# Patient Record
Sex: Female | Born: 1980 | Race: White | Hispanic: No | Marital: Single | Smoking: Current every day smoker
Health system: Southern US, Community
[De-identification: ages and names within clinical notes are randomized; demographics above are authoritative.]

## PROBLEM LIST (undated history)

## (undated) DIAGNOSIS — M1990 Unspecified osteoarthritis, unspecified site: Secondary | ICD-10-CM

## (undated) DIAGNOSIS — A4902 Methicillin resistant Staphylococcus aureus infection, unspecified site: Secondary | ICD-10-CM

## (undated) DIAGNOSIS — R569 Unspecified convulsions: Secondary | ICD-10-CM

## (undated) HISTORY — PX: REPLACEMENT TOTAL KNEE BILATERAL: SUR1225

## (undated) HISTORY — PX: OTHER SURGICAL HISTORY: SHX169

## (undated) HISTORY — PX: ABDOMINAL SURGERY: SHX537

## (undated) HISTORY — PX: KNEE SURGERY: SHX244

---

## 2002-06-28 ENCOUNTER — Emergency Department (HOSPITAL_COMMUNITY): Admission: EM | Admit: 2002-06-28 | Discharge: 2002-06-28 | Payer: Self-pay | Admitting: Emergency Medicine

## 2005-08-07 ENCOUNTER — Ambulatory Visit (HOSPITAL_COMMUNITY): Admission: RE | Admit: 2005-08-07 | Discharge: 2005-08-07 | Payer: Self-pay | Admitting: *Deleted

## 2005-08-14 ENCOUNTER — Ambulatory Visit: Payer: Self-pay | Admitting: *Deleted

## 2005-10-02 ENCOUNTER — Ambulatory Visit: Payer: Self-pay | Admitting: Family Medicine

## 2011-03-12 ENCOUNTER — Emergency Department (HOSPITAL_BASED_OUTPATIENT_CLINIC_OR_DEPARTMENT_OTHER)
Admission: EM | Admit: 2011-03-12 | Discharge: 2011-03-12 | Disposition: A | Discharge status: Home / Self Care | Payer: Self-pay | Attending: Emergency Medicine | Admitting: Emergency Medicine

## 2011-03-12 DIAGNOSIS — F1910 Other psychoactive substance abuse, uncomplicated: Secondary | ICD-10-CM | POA: Insufficient documentation

## 2011-03-12 DIAGNOSIS — R21 Rash and other nonspecific skin eruption: Secondary | ICD-10-CM | POA: Insufficient documentation

## 2011-03-12 DIAGNOSIS — L089 Local infection of the skin and subcutaneous tissue, unspecified: Secondary | ICD-10-CM | POA: Insufficient documentation

## 2011-03-12 LAB — POCT TOXICOLOGY PANEL
Benzodiazepines: POSITIVE
Cocaine: POSITIVE
Tetrahydrocannabinol: POSITIVE

## 2011-03-12 LAB — COMPREHENSIVE METABOLIC PANEL
Albumin: 3.4 g/dL — ABNORMAL LOW (ref 3.5–5.2)
Glucose, Bld: 107 mg/dL — ABNORMAL HIGH (ref 70–99)
Potassium: 4.1 mEq/L (ref 3.5–5.1)

## 2011-03-12 LAB — CBC
HCT: 31.1 % — ABNORMAL LOW (ref 36.0–46.0)
Hemoglobin: 10.1 g/dL — ABNORMAL LOW (ref 12.0–15.0)
MCH: 25.1 pg — ABNORMAL LOW (ref 26.0–34.0)
MCHC: 32.5 g/dL (ref 30.0–36.0)
Platelets: 305 10*3/uL (ref 150–400)
RDW: 18 % — ABNORMAL HIGH (ref 11.5–15.5)

## 2011-03-12 LAB — DIFFERENTIAL
Basophils Absolute: 0 10*3/uL (ref 0.0–0.1)
Basophils Relative: 0 % (ref 0–1)
Lymphocytes Relative: 25 % (ref 12–46)
Lymphs Abs: 2.5 10*3/uL (ref 0.7–4.0)
Monocytes Relative: 6 % (ref 3–12)
Neutro Abs: 6.6 10*3/uL (ref 1.7–7.7)
Neutrophils Relative %: 68 % (ref 43–77)

## 2011-03-12 LAB — PREGNANCY, URINE: Preg Test, Ur: NEGATIVE

## 2011-03-12 LAB — URINALYSIS, ROUTINE W REFLEX MICROSCOPIC: Hgb urine dipstick: NEGATIVE

## 2011-06-02 ENCOUNTER — Emergency Department (HOSPITAL_BASED_OUTPATIENT_CLINIC_OR_DEPARTMENT_OTHER)
Admission: EM | Admit: 2011-06-02 | Discharge: 2011-06-02 | Disposition: A | Discharge status: Other Acute Inpatient Hospital | Payer: Self-pay | Attending: Emergency Medicine | Admitting: Emergency Medicine

## 2011-06-02 ENCOUNTER — Emergency Department (INDEPENDENT_AMBULATORY_CARE_PROVIDER_SITE_OTHER): Payer: Self-pay

## 2011-06-02 DIAGNOSIS — K5900 Constipation, unspecified: Secondary | ICD-10-CM

## 2011-06-02 DIAGNOSIS — L02219 Cutaneous abscess of trunk, unspecified: Secondary | ICD-10-CM

## 2011-06-02 DIAGNOSIS — R109 Unspecified abdominal pain: Secondary | ICD-10-CM | POA: Insufficient documentation

## 2011-06-02 DIAGNOSIS — T8131XA Disruption of external operation (surgical) wound, not elsewhere classified, initial encounter: Secondary | ICD-10-CM | POA: Insufficient documentation

## 2011-06-02 DIAGNOSIS — Y838 Other surgical procedures as the cause of abnormal reaction of the patient, or of later complication, without mention of misadventure at the time of the procedure: Secondary | ICD-10-CM | POA: Insufficient documentation

## 2011-06-02 DIAGNOSIS — K567 Ileus, unspecified: Secondary | ICD-10-CM

## 2011-06-02 DIAGNOSIS — J90 Pleural effusion, not elsewhere classified: Secondary | ICD-10-CM

## 2011-06-02 HISTORY — DX: Unspecified convulsions: R56.9

## 2011-06-02 LAB — URINALYSIS, ROUTINE W REFLEX MICROSCOPIC
Bilirubin Urine: NEGATIVE
Leukocytes, UA: NEGATIVE
Protein, ur: NEGATIVE mg/dL
Specific Gravity, Urine: 1.017 (ref 1.005–1.030)

## 2011-06-02 LAB — BASIC METABOLIC PANEL
BUN: 11 mg/dL (ref 6–23)
Calcium: 9.6 mg/dL (ref 8.4–10.5)
Chloride: 100 mEq/L (ref 96–112)
Creatinine, Ser: 0.7 mg/dL (ref 0.50–1.10)
GFR calc Af Amer: >60 mL/min (ref >60)
GFR calc non Af Amer: >60 mL/min (ref >60)
Glucose, Bld: 97 mg/dL (ref 70–99)
Potassium: 4.7 mEq/L (ref 3.5–5.1)
Sodium: 137 mEq/L (ref 135–145)

## 2011-06-02 LAB — CBC
MCV: 82.8 fL (ref 78.0–100.0)
Platelets: 364 10*3/uL (ref 150–400)
RDW: 17.6 % — ABNORMAL HIGH (ref 11.5–15.5)
WBC: 11.8 10*3/uL — ABNORMAL HIGH (ref 4.0–10.5)

## 2011-06-02 LAB — DIFFERENTIAL: Basophils Relative: 0 % (ref 0–1)

## 2011-06-02 MED ORDER — VANCOMYCIN HCL IN DEXTROSE 1-5 GM/200ML-% IV SOLN
INTRAVENOUS | Status: AC
  Administered 2011-06-02: 1 g
  Filled 2011-06-02: qty 200

## 2011-06-02 MED ORDER — HYDROMORPHONE HCL 1 MG/ML IJ SOLN
0.5000 mg | Freq: Once | INTRAMUSCULAR | Status: AC
  Administered 2011-06-02: 0.5 mg via INTRAVENOUS

## 2011-06-02 MED ORDER — VANCOMYCIN HCL 10 G IV SOLR: 1.0000 g | Freq: Once | INTRAVENOUS | Status: AC

## 2011-06-02 MED ORDER — SODIUM CHLORIDE 0.9 % IV SOLN
INTRAVENOUS | Status: DC
  Administered 2011-06-02: 17:00:00 via INTRAVENOUS
  Filled 2011-06-02: qty 1000

## 2011-06-02 MED ORDER — HYDROMORPHONE HCL 1 MG/ML IJ SOLN
INTRAMUSCULAR | Status: AC
  Administered 2011-06-02: 0.5 mg via INTRAVENOUS
  Filled 2011-06-02: qty 1

## 2011-06-02 MED ORDER — MOXIFLOXACIN HCL IN NACL 400 MG/250ML IV SOLN
400.0000 mg | Freq: Once | INTRAVENOUS | Status: AC
  Administered 2011-06-02: 400 mg via INTRAVENOUS
  Filled 2011-06-02: qty 250

## 2011-06-02 MED ORDER — FENTANYL CITRATE 0.05 MG/ML IJ SOLN
50.0000 ug | Freq: Once | INTRAMUSCULAR | Status: AC
  Administered 2011-06-02: 50 ug via INTRAVENOUS
  Filled 2011-06-02: qty 2

## 2011-06-02 NOTE — ED Notes (Signed)
Pt. Report given to Tommy RN with [facility] by RN Earlene Plater.   RN Earlene Plater to call report to [facility]

## 2011-06-02 NOTE — ED Notes (Signed)
Pt. Is alert and oriented with no resp. Distress and no c/o nausea or vomiting just PAIN.

## 2011-06-02 NOTE — ED Notes (Signed)
Family at bedside. 

## 2011-06-02 NOTE — ED Notes (Signed)
Pt. Has shown no s/s of seizure activity.  Pt. Neuro status is WNL.  Pt. Resting with eyes closed after meds given.

## 2011-06-02 NOTE — ED Notes (Signed)
Pt. Records have been faxed from [facility] to [facility].  RN Earlene Plater gave records to Dr. Nicanor Alcon.

## 2011-06-02 NOTE — ED Notes (Signed)
Pt. Went into a verbal escalation about wanting to leave due to the medicine taking too long to infuse.  Per Pt. "I gotta go now, I gotta go.  Explained to pt. The Dr. Is waiting to review her xray results and the abx. Was ordered by the EDP.  Pt. con't to have a fit and report I gotta go.  Dr.  Nicanor Alcon called into the Pt. Room and she sat and spoke with Pt. About needs to stay and Pt. Actions were compliant while Dr. Nicanor Alcon in the room.  After the EDP left the room the Pt. Reports "I want to leave".  Explained to Pt. If she was planning to go I the RN needs to report to the EDP before I administer pain meds.  Pt. Calling on the call bell 5 times in 10 mins.  Pt. Is in no distress and is alert and oriented.

## 2011-06-02 NOTE — ED Notes (Signed)
Admitted on 7/1 for infected abscess right groin; stayed in hospital x 1 week; d/c yesterday; increased pain and drainage; is currently on doxycycline; surgeon: Dr Sondra Come in [facility]; states she didn't go to [facility] because they were told her a 3 hour wait; so they came here

## 2011-06-02 NOTE — ED Provider Notes (Signed)
History     Chief Complaint  Patient presents with  . Wound Dehiscence   Patient is a 30 y.o. female presenting with abdominal pain. The history is provided by the patient. No language interpreter was used.  Abdominal Pain The primary symptoms of the illness include abdominal pain. The primary symptoms of the illness do not include fever, fatigue, shortness of breath, diarrhea, dysuria, vaginal discharge or vaginal bleeding. The current episode started more than 2 days ago. The onset of the illness was gradual. The problem has not changed since onset. The illness is associated with a recent illness. The patient has not had a change in bowel habit. Risk factors for an acute abdominal problem include a history of abdominal surgery. Symptoms associated with the illness do not include chills, anorexia, diaphoresis, heartburn, urgency or frequency.  Patient was discharged from [facility] yesterday after having abdominal wall surgery on Thursday.  Has not followed up with the surgeon and is having increasing pain and was told by the office to go to [facility] but did not go there due to wait time.   Past Medical History  Diagnosis Date  . Seizures     No past surgical history on file.  No family history on file.  History  Substance Use Topics  . Smoking status: Current Everyday Smoker -- 0.5 packs/day  . Smokeless tobacco: Not on file  . Alcohol Use: No    OB History    Grav Para Term Preterm Abortions TAB SAB Ect Mult Living                  Review of Systems  Constitutional: Negative for fever, chills, diaphoresis and fatigue.  HENT: Negative for facial swelling.   Eyes: Negative for discharge.  Respiratory: Negative for shortness of breath.   Cardiovascular: Negative for chest pain.  Gastrointestinal: Positive for abdominal pain. Negative for heartburn, diarrhea and anorexia.  Genitourinary: Negative for dysuria, urgency, frequency, vaginal bleeding, vaginal discharge and  difficulty urinating.  Musculoskeletal: Negative.   Neurological: Negative.   Hematological: Negative.   Psychiatric/Behavioral: Negative.     Physical Exam  BP 115/56  Pulse 88  Temp(Src) 98.9 F (37.2 C) (Oral)  Resp 20  SpO2 97%  Physical Exam  Constitutional: She is oriented to person, place, and time. She appears well-developed and well-nourished.  HENT:  Head: Normocephalic and atraumatic.  Eyes: EOM are normal. Pupils are equal, round, and reactive to light. Left eye exhibits no discharge.  Neck: Normal range of motion. Neck supple.  Cardiovascular: Normal rate and regular rhythm.   Pulmonary/Chest: Effort normal and breath sounds normal.  Abdominal: Bowel sounds are normal. There is tenderness. There is no rebound and no guarding.    Genitourinary: There is no rash on the right labia. There is no rash on the left labia.  Musculoskeletal: Normal range of motion.  Neurological: She is alert and oriented to person, place, and time.  Skin: Skin is warm and dry.  Psychiatric: Her behavior is normal.    ED Course  Procedures  MDM Previous admission at [facility] reviewed     Alvaro Aungst Smitty Cords, MD 06/02/11 1903

## 2011-06-02 NOTE — ED Notes (Signed)
Patient is resting comfortably. 

## 2011-06-02 NOTE — ED Notes (Signed)
Report called to [employer] on [employer]

## 2011-06-02 NOTE — ED Notes (Signed)
Vital signs stable. 

## 2011-07-29 ENCOUNTER — Emergency Department (INDEPENDENT_AMBULATORY_CARE_PROVIDER_SITE_OTHER): Payer: Medicaid Other

## 2011-07-29 ENCOUNTER — Emergency Department (HOSPITAL_BASED_OUTPATIENT_CLINIC_OR_DEPARTMENT_OTHER)
Admission: EM | Admit: 2011-07-29 | Discharge: 2011-07-29 | Disposition: A | Discharge status: Home / Self Care | Payer: Medicaid Other | Attending: Emergency Medicine | Admitting: Emergency Medicine

## 2011-07-29 ENCOUNTER — Encounter (HOSPITAL_BASED_OUTPATIENT_CLINIC_OR_DEPARTMENT_OTHER): Payer: Self-pay | Admitting: *Deleted

## 2011-07-29 DIAGNOSIS — A4902 Methicillin resistant Staphylococcus aureus infection, unspecified site: Secondary | ICD-10-CM

## 2011-07-29 DIAGNOSIS — M79609 Pain in unspecified limb: Secondary | ICD-10-CM | POA: Insufficient documentation

## 2011-07-29 DIAGNOSIS — M2550 Pain in unspecified joint: Secondary | ICD-10-CM

## 2011-07-29 DIAGNOSIS — M546 Pain in thoracic spine: Secondary | ICD-10-CM

## 2011-07-29 DIAGNOSIS — R1032 Left lower quadrant pain: Secondary | ICD-10-CM | POA: Insufficient documentation

## 2011-07-29 DIAGNOSIS — L0291 Cutaneous abscess, unspecified: Secondary | ICD-10-CM

## 2011-07-29 DIAGNOSIS — M549 Dorsalgia, unspecified: Secondary | ICD-10-CM

## 2011-07-29 DIAGNOSIS — M5380 Other specified dorsopathies, site unspecified: Secondary | ICD-10-CM | POA: Insufficient documentation

## 2011-07-29 DIAGNOSIS — R109 Unspecified abdominal pain: Secondary | ICD-10-CM

## 2011-07-29 DIAGNOSIS — M62838 Other muscle spasm: Secondary | ICD-10-CM

## 2011-07-29 LAB — CBC
HCT: 29.9 % — ABNORMAL LOW (ref 36.0–46.0)
Hemoglobin: 9.6 g/dL — ABNORMAL LOW (ref 12.0–15.0)
MCH: 26.7 pg (ref 26.0–34.0)
MCHC: 32.1 g/dL (ref 30.0–36.0)
MCV: 83.1 fL (ref 78.0–100.0)
Platelets: 308 10*3/uL (ref 150–400)
RBC: 3.6 MIL/uL — ABNORMAL LOW (ref 3.87–5.11)
RDW: 16.6 % — ABNORMAL HIGH (ref 11.5–15.5)
WBC: 11 10*3/uL — ABNORMAL HIGH (ref 4.0–10.5)

## 2011-07-29 LAB — DIFFERENTIAL
Basophils Absolute: 0 10*3/uL (ref 0.0–0.1)
Basophils Relative: 0 % (ref 0–1)
Eosinophils Absolute: 0.5 K/uL (ref 0.0–0.7)
Eosinophils Relative: 5 % (ref 0–5)
Lymphocytes Relative: 26 % (ref 12–46)
Lymphs Abs: 2.8 K/uL (ref 0.7–4.0)
Monocytes Absolute: 1.1 K/uL — ABNORMAL HIGH (ref 0.1–1.0)
Monocytes Relative: 10 % (ref 3–12)
Neutro Abs: 6.6 10*3/uL (ref 1.7–7.7)
Neutrophils Relative %: 60 % (ref 43–77)

## 2011-07-29 LAB — COMPREHENSIVE METABOLIC PANEL WITH GFR
Alkaline Phosphatase: 77 U/L (ref 39–117)
BUN: 19 mg/dL (ref 6–23)
Calcium: 8.7 mg/dL (ref 8.4–10.5)
GFR calc Af Amer: >60 mL/min (ref >60)
Glucose, Bld: 83 mg/dL (ref 70–99)
Potassium: 4.2 meq/L (ref 3.5–5.1)
Total Protein: 7.6 g/dL (ref 6.0–8.3)

## 2011-07-29 LAB — URINALYSIS, ROUTINE W REFLEX MICROSCOPIC
Bilirubin Urine: NEGATIVE
Glucose, UA: NEGATIVE mg/dL
Hgb urine dipstick: NEGATIVE
Ketones, ur: 15 mg/dL — AB
Nitrite: NEGATIVE
Protein, ur: NEGATIVE mg/dL
Specific Gravity, Urine: 1.021 (ref 1.005–1.030)
Urobilinogen, UA: 1 mg/dL (ref 0.0–1.0)
pH: 7.5 (ref 5.0–8.0)

## 2011-07-29 LAB — COMPREHENSIVE METABOLIC PANEL
ALT: 7 U/L (ref 0–35)
AST: 10 U/L (ref 0–37)
Albumin: 3 g/dL — ABNORMAL LOW (ref 3.5–5.2)
CO2: 25 mEq/L (ref 19–32)
Chloride: 100 mEq/L (ref 96–112)
Creatinine, Ser: 0.5 mg/dL (ref 0.50–1.10)
GFR calc non Af Amer: >60 mL/min (ref >60)
Sodium: 136 mEq/L (ref 135–145)
Total Bilirubin: 0.2 mg/dL — ABNORMAL LOW (ref 0.3–1.2)

## 2011-07-29 LAB — URINE MICROSCOPIC-ADD ON

## 2011-07-29 LAB — LIPASE, BLOOD: Lipase: 16 U/L (ref 11–59)

## 2011-07-29 MED ORDER — HYDROCODONE-ACETAMINOPHEN 5-500 MG PO TABS: 2.0000 | ORAL_TABLET | Freq: Four times a day (QID) | ORAL | Status: AC | PRN

## 2011-07-29 MED ORDER — KETOROLAC TROMETHAMINE 30 MG/ML IJ SOLN
30.0000 mg | Freq: Once | INTRAMUSCULAR | Status: AC
  Administered 2011-07-29: 30 mg via INTRAVENOUS
  Filled 2011-07-29: qty 1

## 2011-07-29 MED ORDER — HYDROMORPHONE HCL 1 MG/ML IJ SOLN
1.0000 mg | Freq: Once | INTRAMUSCULAR | Status: AC
  Administered 2011-07-29: 1 mg via INTRAVENOUS
  Filled 2011-07-29: qty 1

## 2011-07-29 MED ORDER — IOHEXOL 300 MG/ML  SOLN
100.0000 mL | Freq: Once | INTRAMUSCULAR | Status: AC | PRN
  Administered 2011-07-29: 100 mL via INTRAVENOUS

## 2011-07-29 MED ORDER — SODIUM CHLORIDE 0.9 % IV SOLN
Freq: Once | INTRAVENOUS | Status: AC
  Administered 2011-07-29: 20:00:00 via INTRAVENOUS

## 2011-07-29 MED ORDER — METAXALONE 800 MG PO TABS: 800.0000 mg | ORAL_TABLET | Freq: Three times a day (TID) | ORAL | Status: AC

## 2011-07-29 NOTE — ED Notes (Signed)
Dr Oletta Lamas at bedside to discuss test results.

## 2011-07-29 NOTE — ED Provider Notes (Signed)
History     CSN: [id number] Arrival date & time: 07/29/2011  6:58 PM  Chief Complaint  Patient presents with  . Joint Pain   HPI Comments: Patient reports that early this morning and last night she had turned in her sleep in the bed and had sudden onset of bilateral upper thigh and lower abdominal pain. She reports severe pain with moving her hips or upper legs were trying to bear weight. She reports it is too painful to bear weight and walk. 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 She did see her [facility] surgeon in clinic about 1.5 weeks prior and appeared to be healing well and was actually taken off of oral Bactrim. Family member reports that he had changed her dressing 2 days prior and noticed that there was a new buildup of green purulent fluid. Both he and the patient reports that there is a foul odor coming from it which is much worse than typical. She denies any fevers or chills. She denies any recent trauma or injury. She has had some pain in her low back recently but was not significant and had not been taking any medications for it. She denies any dysuria, and hematuria. She denies any flank pain. She denies numbness or weakness to her distal lower extremities. She denies any significant skin rash.  The history is provided by the patient and a relative.    Past Medical History  Diagnosis Date  . Seizures     Past Surgical History  Procedure Date  . Mrsa     No family history on file.  History  Substance Use Topics  . Smoking status: Current Everyday Smoker -- 0.5 packs/day  . Smokeless tobacco: Not on file  . Alcohol Use: No    OB History    Grav Para Term Preterm Abortions TAB SAB Ect Mult Living                  Review of Systems  All other systems reviewed and are negative.    Physical Exam  BP 104/61  Pulse 93  Temp(Src) 98.2 F (36.8 C) (Oral)  Resp 22   SpO2 100%  Physical Exam  Nursing note and vitals reviewed. Constitutional: She appears well-developed and well-nourished. She appears distressed.  HENT:  Head: Normocephalic and atraumatic.  Neck: Normal range of motion. Neck supple.  Cardiovascular: Normal rate.   Pulmonary/Chest: Effort normal. No respiratory distress.  Abdominal: Soft. There is tenderness in the left lower quadrant. There is guarding. There is no rigidity, no CVA tenderness, no tenderness at McBurney's point and negative Murphy's sign.    Musculoskeletal:       No deformity, rash, minimally tender to palpation of proximal thighs L>R.  No distal weakness plantar or dorsiflexion of ankles    ED Course  Procedures  MDM CT scan done to make sure infection/abscess was not moving internally.  Pt had back pain and here has some spasms.  CT scan is reassuring and I informed results to pt adn familly.  Will treat as muscle spasms and back pain.  Pt should establish with [facility] and begin seeing their primary care clinics.    Pt will need to continue dressing changes to wound area, keeping it clear. They can be rechecked by their surgeon next week as well.        Gavin Pound. Oletta Lamas, MD 07/29/11 2204

## 2011-07-29 NOTE — ED Notes (Signed)
Joint pain sudden onset last night.

## 2011-07-29 NOTE — Discharge Instructions (Signed)
Narcotic and benzodiazepine use may cause drowsiness, slowed breathing or dependence.  Please use with caution and do not drive, operate machinery or watch young children alone while taking them.  Taking combinations of these medications or drinking alcohol will potentiate these effects.    

## 2011-07-29 NOTE — ED Notes (Signed)
Pt assisted to bedside commode to provide urine specimen.

## 2011-07-29 NOTE — ED Notes (Signed)
Pt has had two surgeries to LLQ of abdomen to clean out MRSA. Last surgery was August 2nd. Pt had wound vac in place until two weeks ago, and then was instructed to do dressing changes at home, which pt has done daily. Husband states the appearance of the wound is not improving, has noticed greenish-yellow drainage and the odor is worsening. Pt reports severe pain to the wound site. Denies any new medications. Denies being on antibiotics at this time. Has a F/U appt at wound clinic this Friday.

## 2011-07-31 ENCOUNTER — Encounter (HOSPITAL_BASED_OUTPATIENT_CLINIC_OR_DEPARTMENT_OTHER): Payer: Self-pay

## 2011-07-31 ENCOUNTER — Emergency Department (HOSPITAL_BASED_OUTPATIENT_CLINIC_OR_DEPARTMENT_OTHER)
Admission: EM | Admit: 2011-07-31 | Discharge: 2011-07-31 | Disposition: A | Discharge status: Home / Self Care | Payer: Medicaid Other | Attending: Emergency Medicine | Admitting: Emergency Medicine

## 2011-07-31 DIAGNOSIS — R609 Edema, unspecified: Secondary | ICD-10-CM | POA: Insufficient documentation

## 2011-07-31 DIAGNOSIS — M2550 Pain in unspecified joint: Secondary | ICD-10-CM

## 2011-07-31 DIAGNOSIS — M79609 Pain in unspecified limb: Secondary | ICD-10-CM | POA: Insufficient documentation

## 2011-07-31 DIAGNOSIS — F17200 Nicotine dependence, unspecified, uncomplicated: Secondary | ICD-10-CM | POA: Insufficient documentation

## 2011-07-31 MED ORDER — HYDROCODONE-ACETAMINOPHEN 5-325 MG PO TABS
2.0000 | ORAL_TABLET | Freq: Once | ORAL | Status: AC
  Administered 2011-07-31: 2 via ORAL
  Filled 2011-07-31: qty 2

## 2011-07-31 NOTE — ED Notes (Signed)
Patient states she was here two days ago for same symptoms.  Patient states she has an appointment with a Rheumatologist @ [facility].  Patient states did not get her prescriptions filled due to finances.  Patient is here for pain management and a referral to an ortho md.

## 2011-07-31 NOTE — ED Provider Notes (Signed)
History     CSN: [id number] Arrival date & time: 07/31/2011 12:23 PM  Chief Complaint  Patient presents with  . Leg Pain   HPI Comments: Pt states that that she has been having ongoing leg pain and hand pain:pt states that she is supposed to see a rheumatologist in a couple of days:pt states that she was seen 2 days ago and she could not get her medication filled so she is here because she needs something for pain  Patient is a 30 y.o. female presenting with leg pain. The history is provided by the patient. No language interpreter was used.  Leg Pain  The incident occurred more than 1 week ago. There was no injury mechanism. The pain location is generalized. The quality of the pain is described as throbbing. The pain is severe. The pain has been constant since onset. Pertinent negatives include no numbness, no inability to bear weight and no loss of motion. She reports no foreign bodies present. The symptoms are aggravated by activity.    Past Medical History  Diagnosis Date  . Seizures     Past Surgical History  Procedure Date  . Mrsa     No family history on file.  History  Substance Use Topics  . Smoking status: Current Everyday Smoker -- 0.5 packs/day  . Smokeless tobacco: Not on file  . Alcohol Use: No    OB History    Grav Para Term Preterm Abortions TAB SAB Ect Mult Living                  Review of Systems  Constitutional: Negative for fever.  Neurological: Negative for numbness.  All other systems reviewed and are negative.    Physical Exam  BP 103/63  Pulse 105  Temp(Src) 98.2 F (36.8 C) (Oral)  Resp 22  Ht 5\' 1"  (1.549 m)  Wt 190 lb (86.183 kg)  BMI 35.90 kg/m2  SpO2 97%  LMP 07/26/2011  Physical Exam  Nursing note and vitals reviewed. Constitutional: She is oriented to person, place, and time. She appears well-developed and well-nourished.  Cardiovascular: Normal rate and regular rhythm.   Pulmonary/Chest: Effort normal and breath sounds  normal.  Abdominal: Soft.  Musculoskeletal:       No redness noted to extremity:mild edema noted to feet bilaterally  Neurological: She is alert and oriented to person, place, and time.  Skin: Skin is warm and dry.  Psychiatric: She has a normal mood and affect.    ED Course  Procedures  MDM Pt given dose of pain medication here:pt is to see rheumatology for work up:pt was given 25 hydrocodone script 2 days ago which she states she can't get filled until tommorrow  Medical screening examination/treatment/procedure(s) were performed by non-physician practitioner and as supervising physician I was immediately available for consultation/collaboration. Osvaldo Human, M.D.    Teressa Lower, NP 07/31/11 1305  Carleene Cooper III, MD 08/01/11 1037

## 2011-07-31 NOTE — ED Notes (Signed)
C/o increase to leg pain and abd wound-was seen here for same 2 days ago

## 2011-08-13 ENCOUNTER — Encounter (HOSPITAL_BASED_OUTPATIENT_CLINIC_OR_DEPARTMENT_OTHER): Payer: Self-pay | Admitting: *Deleted

## 2011-08-13 ENCOUNTER — Emergency Department (HOSPITAL_BASED_OUTPATIENT_CLINIC_OR_DEPARTMENT_OTHER)
Admission: EM | Admit: 2011-08-13 | Discharge: 2011-08-13 | Disposition: A | Discharge status: Home / Self Care | Payer: Medicaid Other | Attending: Emergency Medicine | Admitting: Emergency Medicine

## 2011-08-13 DIAGNOSIS — L02219 Cutaneous abscess of trunk, unspecified: Secondary | ICD-10-CM | POA: Insufficient documentation

## 2011-08-13 DIAGNOSIS — F17200 Nicotine dependence, unspecified, uncomplicated: Secondary | ICD-10-CM | POA: Insufficient documentation

## 2011-08-13 DIAGNOSIS — L03319 Cellulitis of trunk, unspecified: Secondary | ICD-10-CM | POA: Insufficient documentation

## 2011-08-13 DIAGNOSIS — L02211 Cutaneous abscess of abdominal wall: Secondary | ICD-10-CM

## 2011-08-13 MED ORDER — CLINDAMYCIN HCL 300 MG PO CAPS: 300.0000 mg | ORAL_CAPSULE | Freq: Three times a day (TID) | ORAL | Status: AC

## 2011-08-13 MED ORDER — IBUPROFEN 800 MG PO TABS
800.0000 mg | ORAL_TABLET | Freq: Once | ORAL | Status: AC
  Administered 2011-08-13: 800 mg via ORAL
  Filled 2011-08-13: qty 1

## 2011-08-13 NOTE — ED Provider Notes (Signed)
Medical screening examination/treatment/procedure(s) were performed by non-physician practitioner and as supervising physician I was immediately available for consultation/collaboration.   Juletta Berhe B. Bernette Mayers, MD 08/13/11 2128

## 2011-08-13 NOTE — ED Provider Notes (Signed)
History     CSN: [id number] Arrival date & time: 08/13/2011  7:51 PM   Chief Complaint  Patient presents with  . Abscess     (Include location/radiation/quality/duration/timing/severity/associated sxs/prior treatment) Patient is a 30 y.o. female presenting with abscess. The history is provided by the patient. No language interpreter was used.  Abscess  This is a recurrent problem. The current episode started more than one week ago. The problem occurs continuously. Progression since onset: pt states that it was getting better and then it got worse agian:pt states that she was given some antibiotics, but she is unsure of which ones. The abscess is present on the abdomen and genitalia. The problem is mild. The abscess is characterized by painfulness, redness and draining.     Past Medical History  Diagnosis Date  . Seizures      Past Surgical History  Procedure Date  . Mrsa     History reviewed. No pertinent family history.  History  Substance Use Topics  . Smoking status: Current Everyday Smoker -- 0.5 packs/day  . Smokeless tobacco: Not on file  . Alcohol Use: No    OB History    Grav Para Term Preterm Abortions TAB SAB Ect Mult Living                  Review of Systems  Constitutional: Negative.   HENT: Negative.   Respiratory: Negative.   Cardiovascular: Negative.   Musculoskeletal: Negative.   Skin: Positive for wound.    Allergies  Penicillins  Home Medications   Current Outpatient Rx  Name Route Sig Dispense Refill  . DIVALPROEX SODIUM 500 MG PO TBEC Oral Take 500-1,000 mg by mouth 2 (two) times daily. Take 2 tabs in the morning and 1 tab at bedtime    . DULOXETINE HCL 20 MG PO CPEP Oral Take 20 mg by mouth daily.     . DULOXETINE HCL 60 MG PO CPEP Oral Take 120 mg by mouth daily.     . QUETIAPINE FUMARATE 300 MG PO TABS Oral Take 600 mg by mouth at bedtime.     Marland Kitchen DOXYCYCLINE HYCLATE 100 MG PO CPEP Oral Take 100 mg by mouth 2 (two) times daily.       Marland Kitchen HYDROCODONE-ACETAMINOPHEN 10-650 MG PO TABS Oral Take 1 tablet by mouth every 6 (six) hours as needed.      Marland Kitchen HYDROCODONE-ACETAMINOPHEN 10-325 MG PO TABS Oral Take 1 tablet by mouth every 8 (eight) hours as needed. For pain    . IBUPROFEN 200 MG PO TABS Oral Take 800 mg by mouth every 6 (six) hours as needed. pain       Physical Exam    BP 130/76  Pulse 93  Temp(Src) 98.3 F (36.8 C) (Oral)  Resp 18  Ht 5\' 1"  (1.549 m)  Wt 190 lb (86.183 kg)  BMI 35.90 kg/m2  SpO2 96%  LMP 07/25/2011  Physical Exam  Nursing note and vitals reviewed. Constitutional: She appears well-developed and well-nourished.  HENT:  Head: Normocephalic.  Cardiovascular: Normal rate and regular rhythm.   Pulmonary/Chest: Effort normal and breath sounds normal.  Musculoskeletal: Normal range of motion.  Neurological: She is alert.  Skin:       Pt has multiple areas to pubic region that is are small white pustule with mild redness to the area:pt has a open wound in the fold of the left panus    ED Course  Procedures    No diagnosis found.   MDM  Pt has a recent ct scan to evaluate for not intra abdominal involvement:will treat with antibiotics       Teressa Lower, NP 08/13/11 2121

## 2011-08-13 NOTE — ED Notes (Signed)
2x2 placed over wound on left lower abd and secured with paper tape. Pt educated on keeping area clean and dry.

## 2011-08-13 NOTE — ED Notes (Signed)
Pt c/o abscess x 2 on vaginal area.

## 2011-12-20 ENCOUNTER — Encounter (HOSPITAL_BASED_OUTPATIENT_CLINIC_OR_DEPARTMENT_OTHER): Payer: Self-pay | Admitting: *Deleted

## 2011-12-20 ENCOUNTER — Emergency Department (HOSPITAL_BASED_OUTPATIENT_CLINIC_OR_DEPARTMENT_OTHER)
Admission: EM | Admit: 2011-12-20 | Discharge: 2011-12-21 | Disposition: A | Discharge status: Home / Self Care | Payer: Medicaid Other | Attending: Emergency Medicine | Admitting: Emergency Medicine

## 2011-12-20 ENCOUNTER — Emergency Department (HOSPITAL_BASED_OUTPATIENT_CLINIC_OR_DEPARTMENT_OTHER)
Admission: EM | Admit: 2011-12-20 | Discharge: 2011-12-20 | Disposition: A | Discharge status: Home / Self Care | Payer: Medicaid Other

## 2011-12-20 DIAGNOSIS — F17200 Nicotine dependence, unspecified, uncomplicated: Secondary | ICD-10-CM | POA: Insufficient documentation

## 2011-12-20 DIAGNOSIS — N83209 Unspecified ovarian cyst, unspecified side: Secondary | ICD-10-CM | POA: Insufficient documentation

## 2011-12-20 DIAGNOSIS — R1012 Left upper quadrant pain: Secondary | ICD-10-CM | POA: Insufficient documentation

## 2011-12-20 LAB — URINALYSIS, ROUTINE W REFLEX MICROSCOPIC
Ketones, ur: 15 mg/dL — AB
Nitrite: NEGATIVE
Protein, ur: NEGATIVE mg/dL

## 2011-12-20 LAB — PREGNANCY, URINE: Preg Test, Ur: NEGATIVE

## 2011-12-20 NOTE — ED Provider Notes (Signed)
History   This chart was scribed for Stacy Roller, MD by Charolett Bumpers . The patient was seen in room MH09/MH09 and the patient's care was started at 11:56pm.   CSN: [id number]  Arrival date & time 12/20/11  2006   First MD Initiated Contact with Patient 12/20/11 2348      Chief Complaint  Patient presents with  . Abdominal Pain    (Consider location/radiation/quality/duration/timing/severity/associated sxs/prior treatment) HPI Stacy Pratt is a 31 y.o. female who presents to the Emergency Department complaining of constant, moderate lower abdominal pain (center and right-sided) that started this morning. The patient reports that she had a similar pain a week ago, but it subsided. She reports being seen at [facility] where they performed a CT scan and found an ovarian cyst. Patient describes the pain as sharp, stabbing, and radiating to her back. Patient states that movement aggravates abdominal pain. Patient also reports associated vomiting and headache.Patient denies hematuria, dysuria, vaginal discharge, and vaginal itching. Patient reports no BMs, but previous BMs had blood in them. Patient reports that she attempted to eat twice today, but vomited each time. Patient also notes that she has an OB/GYN appointment in March about the ovarian cyst.    Past Medical History  Diagnosis Date  . Seizures     Past Surgical History  Procedure Date  . Mrsa     History reviewed. No pertinent family history.  History  Substance Use Topics  . Smoking status: Current Everyday Smoker -- 0.5 packs/day  . Smokeless tobacco: Not on file  . Alcohol Use: No    OB History    Grav Para Term Preterm Abortions TAB SAB Ect Mult Living                  Review of Systems A complete 10 system review of systems was obtained and is otherwise negative except as noted in the HPI and PMH.   Allergies  Penicillins  Home Medications   Current Outpatient Rx  Name Route Sig  Dispense Refill  . DIVALPROEX SODIUM 500 MG PO TBEC Oral Take 500-1,000 mg by mouth 2 (two) times daily. Take 1 tab in the morning and 2 tabs at night    . DULOXETINE HCL 60 MG PO CPEP Oral Take 180 mg by mouth daily.     . IBUPROFEN 200 MG PO TABS Oral Take 800 mg by mouth 2 (two) times daily as needed. For pain    . QUETIAPINE FUMARATE 300 MG PO TABS Oral Take 300 mg by mouth at bedtime.     Marland Kitchen NAPROXEN 500 MG PO TABS Oral Take 1 tablet (500 mg total) by mouth 2 (two) times daily with a meal. 30 tablet 0    BP 117/64  Pulse 76  Temp(Src) 98 F (36.7 C) (Oral)  Resp 20  Ht 5\' 1"  (1.549 m)  Wt 190 lb (86.183 kg)  BMI 35.90 kg/m2  SpO2 98%  LMP 12/13/2011  Physical Exam  Nursing note and vitals reviewed. Constitutional: She is oriented to person, place, and time. She appears well-developed and well-nourished. No distress.  HENT:  Head: Normocephalic and atraumatic.       Tongue is dry.  Eyes: EOM are normal. Pupils are equal, round, and reactive to light.  Neck: Neck supple. No tracheal deviation present.  Cardiovascular: Normal rate, regular rhythm and normal heart sounds.  Exam reveals no gallop and no friction rub.   No murmur heard. Pulmonary/Chest: Effort normal and  breath sounds normal. No respiratory distress. She has no wheezes. She has no rales.  Abdominal: Soft. Bowel sounds are normal. She exhibits no distension and no mass. There is no tenderness. There is no rebound and no guarding.  Musculoskeletal: Normal range of motion. She exhibits no edema.  Neurological: She is alert and oriented to person, place, and time. No sensory deficit.  Skin: Skin is warm and dry.  Psychiatric: She has a normal mood and affect. Her behavior is normal.    ED Course  Procedures (including critical care time)  DIAGNOSTIC STUDIES: Oxygen Saturation is 100% on room air, normal by my interpretation.    COORDINATION OF CARE:  0015: Medication Orders: Hydromorphone, Ondansetron, Sodium  Chloride 0.9%   Labs Reviewed  URINALYSIS, ROUTINE W REFLEX MICROSCOPIC - Abnormal; Notable for the following:    Color, Urine AMBER (*) BIOCHEMICALS MAY BE AFFECTED BY COLOR   Hgb urine dipstick SMALL (*)    Ketones, ur 15 (*)    Leukocytes, UA SMALL (*)    All other components within normal limits  URINE MICROSCOPIC-ADD ON - Abnormal; Notable for the following:    Squamous Epithelial / LPF FEW (*)    All other components within normal limits  CBC - Abnormal; Notable for the following:    RBC 3.69 (*)    Hemoglobin 9.2 (*)    HCT 29.0 (*)    MCH 24.9 (*)    RDW 20.6 (*)    All other components within normal limits  DIFFERENTIAL - Abnormal; Notable for the following:    Neutrophils Relative 40 (*)    Eosinophils Relative 6 (*)    All other components within normal limits  COMPREHENSIVE METABOLIC PANEL - Abnormal; Notable for the following:    BUN 26 (*)    Albumin 2.7 (*)    Total Bilirubin 0.1 (*)    All other components within normal limits  PREGNANCY, URINE   No results found.   1. Ovarian cyst       MDM  Patient reevaluated, has had some improvement with hydromorphone.  I have reviewed her medical records from the outside hospital and found that she has had 2 separate ultrasounds within the last 2 months each showing a complex cyst on the right ovary. As of one week ago this was found to appear to be a hemorrhagic cyst. I have given her intravenous hydromorphone as well as intravenous Toradol prior to discharge. She has had some improvement and is amenable to discharge and followup with OB/GYN. I have given her an extensive list for same. Patient is well appearing at discharge with normal vital signs.  She has had multiple visits to [facility] with multiple CT scans and ultrasounds all revealing the same diagnosis which is right ovarian cyst. There was some discrepancy regarding diagnosis is either hemorrhagic cyst versus complex mass. I have encouraged  her followup closely for further evaluation to make sure that a neoplasm has not missed. She has expressed her understanding and agrees to followup. I have given her the phone numbers for followup OB/GYN in [place].  Laboratory evaluation here shows mild anemia, this is consistent over time, otherwise no significant abnormal findings.   I personally performed the services described in this documentation, which was scribed in my presence. The recorded information has been reviewed and considered.         Stacy Roller, MD 12/21/11 [phone]

## 2011-12-20 NOTE — ED Notes (Signed)
Pt states she has a cyst on her ovary and that is what this feels like.

## 2011-12-20 NOTE — ED Notes (Signed)
Pt states she has a hx of osteoarthritis and is having generalized body aches, cough, decreased energy, N/V.  Also having some CP with cough, Feel dehydrated.

## 2011-12-21 ENCOUNTER — Encounter (HOSPITAL_BASED_OUTPATIENT_CLINIC_OR_DEPARTMENT_OTHER): Payer: Self-pay

## 2011-12-21 LAB — COMPREHENSIVE METABOLIC PANEL
ALT: 9 U/L (ref 0–35)
BUN: 26 mg/dL — ABNORMAL HIGH (ref 6–23)
CO2: 28 mEq/L (ref 19–32)
Calcium: 8.5 mg/dL (ref 8.4–10.5)
GFR calc Af Amer: >90 mL/min (ref >90)
GFR calc non Af Amer: >90 mL/min (ref >90)
Glucose, Bld: 87 mg/dL (ref 70–99)
Sodium: 140 mEq/L (ref 135–145)

## 2011-12-21 LAB — CBC
HCT: 29 % — ABNORMAL LOW (ref 36.0–46.0)
Hemoglobin: 9.2 g/dL — ABNORMAL LOW (ref 12.0–15.0)
MCH: 24.9 pg — ABNORMAL LOW (ref 26.0–34.0)
MCV: 78.6 fL (ref 78.0–100.0)
Platelets: 310 10*3/uL (ref 150–400)
RBC: 3.69 MIL/uL — ABNORMAL LOW (ref 3.87–5.11)
WBC: 6.9 10*3/uL (ref 4.0–10.5)

## 2011-12-21 LAB — DIFFERENTIAL
Eosinophils Relative: 6 % — ABNORMAL HIGH (ref 0–5)
Lymphocytes Relative: 43 % (ref 12–46)
Lymphs Abs: 2.9 10*3/uL (ref 0.7–4.0)
Monocytes Absolute: 0.7 10*3/uL (ref 0.1–1.0)
Monocytes Relative: 10 % (ref 3–12)

## 2011-12-21 MED ORDER — NAPROXEN 500 MG PO TABS: 500.0000 mg | ORAL_TABLET | Freq: Two times a day (BID) | ORAL | Status: DC

## 2011-12-21 MED ORDER — SODIUM CHLORIDE 0.9 % IV BOLUS (SEPSIS)
1000.0000 mL | Freq: Once | INTRAVENOUS | Status: AC
  Administered 2011-12-21: 1000 mL via INTRAVENOUS

## 2011-12-21 MED ORDER — KETOROLAC TROMETHAMINE 30 MG/ML IJ SOLN
30.0000 mg | Freq: Once | INTRAMUSCULAR | Status: AC
  Administered 2011-12-21: 30 mg via INTRAVENOUS

## 2011-12-21 MED ORDER — KETOROLAC TROMETHAMINE 60 MG/2ML IM SOLN: 60.0000 mg | Freq: Once | INTRAMUSCULAR | Status: DC

## 2011-12-21 MED ORDER — KETOROLAC TROMETHAMINE 30 MG/ML IJ SOLN
INTRAMUSCULAR | Status: AC
  Administered 2011-12-21: 30 mg via INTRAVENOUS
  Filled 2011-12-21: qty 1

## 2011-12-21 MED ORDER — HYDROMORPHONE HCL PF 1 MG/ML IJ SOLN
1.0000 mg | Freq: Once | INTRAMUSCULAR | Status: AC
  Administered 2011-12-21: 1 mg via INTRAVENOUS
  Filled 2011-12-21: qty 1

## 2011-12-21 MED ORDER — ONDANSETRON HCL 4 MG/2ML IJ SOLN
4.0000 mg | Freq: Once | INTRAMUSCULAR | Status: AC
  Administered 2011-12-21: 4 mg via INTRAVENOUS
  Filled 2011-12-21: qty 2

## 2011-12-21 NOTE — ED Notes (Signed)
Pt states that her pain is still very "tense" and that she would like another dose of pain medication.  Dr Hyacinth Meeker informed.  No orders received.

## 2012-01-11 ENCOUNTER — Emergency Department (INDEPENDENT_AMBULATORY_CARE_PROVIDER_SITE_OTHER): Payer: Medicaid Other

## 2012-01-11 ENCOUNTER — Emergency Department (HOSPITAL_BASED_OUTPATIENT_CLINIC_OR_DEPARTMENT_OTHER)
Admission: EM | Admit: 2012-01-11 | Discharge: 2012-01-11 | Disposition: A | Discharge status: Home / Self Care | Payer: Medicaid Other | Attending: Emergency Medicine | Admitting: Emergency Medicine

## 2012-01-11 ENCOUNTER — Encounter (HOSPITAL_BASED_OUTPATIENT_CLINIC_OR_DEPARTMENT_OTHER): Payer: Self-pay | Admitting: *Deleted

## 2012-01-11 DIAGNOSIS — R0602 Shortness of breath: Secondary | ICD-10-CM

## 2012-01-11 DIAGNOSIS — R0609 Other forms of dyspnea: Secondary | ICD-10-CM

## 2012-01-11 DIAGNOSIS — J111 Influenza due to unidentified influenza virus with other respiratory manifestations: Secondary | ICD-10-CM | POA: Insufficient documentation

## 2012-01-11 DIAGNOSIS — R059 Cough, unspecified: Secondary | ICD-10-CM | POA: Insufficient documentation

## 2012-01-11 DIAGNOSIS — R05 Cough: Secondary | ICD-10-CM | POA: Insufficient documentation

## 2012-01-11 DIAGNOSIS — R062 Wheezing: Secondary | ICD-10-CM

## 2012-01-11 DIAGNOSIS — F17200 Nicotine dependence, unspecified, uncomplicated: Secondary | ICD-10-CM | POA: Insufficient documentation

## 2012-01-11 DIAGNOSIS — Z79899 Other long term (current) drug therapy: Secondary | ICD-10-CM | POA: Insufficient documentation

## 2012-01-11 LAB — LIPASE, BLOOD: Lipase: 27 U/L (ref 11–59)

## 2012-01-11 LAB — URINALYSIS, ROUTINE W REFLEX MICROSCOPIC
Bilirubin Urine: NEGATIVE
Ketones, ur: NEGATIVE mg/dL
Nitrite: NEGATIVE
Specific Gravity, Urine: 1.015 (ref 1.005–1.030)
Urobilinogen, UA: 1 mg/dL (ref 0.0–1.0)

## 2012-01-11 LAB — COMPREHENSIVE METABOLIC PANEL
BUN: 17 mg/dL (ref 6–23)
CO2: 25 mEq/L (ref 19–32)
Calcium: 8.8 mg/dL (ref 8.4–10.5)
Creatinine, Ser: 0.7 mg/dL (ref 0.50–1.10)
GFR calc Af Amer: >90 mL/min (ref >90)
GFR calc non Af Amer: >90 mL/min (ref >90)
Glucose, Bld: 102 mg/dL — ABNORMAL HIGH (ref 70–99)

## 2012-01-11 LAB — VALPROIC ACID LEVEL: Valproic Acid Lvl: 69.9 ug/mL (ref 50.0–100.0)

## 2012-01-11 LAB — DIFFERENTIAL
Basophils Absolute: 0 10*3/uL (ref 0.0–0.1)
Basophils Relative: 0 % (ref 0–1)
Eosinophils Relative: 2 % (ref 0–5)
Lymphocytes Relative: 32 % (ref 12–46)
Neutro Abs: 3.5 10*3/uL (ref 1.7–7.7)

## 2012-01-11 LAB — URINE MICROSCOPIC-ADD ON

## 2012-01-11 LAB — CBC
MCHC: 32.1 g/dL (ref 30.0–36.0)
Platelets: 279 10*3/uL (ref 150–400)
RDW: 19.7 % — ABNORMAL HIGH (ref 11.5–15.5)
WBC: 6.6 10*3/uL (ref 4.0–10.5)

## 2012-01-11 MED ORDER — SODIUM CHLORIDE 0.9 % IV BOLUS (SEPSIS)
1000.0000 mL | Freq: Once | INTRAVENOUS | Status: AC
  Administered 2012-01-11: 1000 mL via INTRAVENOUS

## 2012-01-11 MED ORDER — HYDROMORPHONE HCL PF 1 MG/ML IJ SOLN
1.0000 mg | Freq: Once | INTRAMUSCULAR | Status: AC
  Administered 2012-01-11: 1 mg via INTRAVENOUS
  Filled 2012-01-11: qty 1

## 2012-01-11 MED ORDER — HYDROCODONE-ACETAMINOPHEN 5-325 MG PO TABS: 2.0000 | ORAL_TABLET | ORAL | Status: DC | PRN

## 2012-01-11 MED ORDER — ALBUTEROL SULFATE (5 MG/ML) 0.5% IN NEBU
5.0000 mg | INHALATION_SOLUTION | Freq: Once | RESPIRATORY_TRACT | Status: AC
  Administered 2012-01-11: 5 mg via RESPIRATORY_TRACT

## 2012-01-11 MED ORDER — IPRATROPIUM BROMIDE 0.02 % IN SOLN
0.5000 mg | Freq: Once | RESPIRATORY_TRACT | Status: AC
  Administered 2012-01-11: 0.5 mg via RESPIRATORY_TRACT

## 2012-01-11 MED ORDER — IPRATROPIUM BROMIDE 0.02 % IN SOLN
RESPIRATORY_TRACT | Status: AC
  Administered 2012-01-11: 0.5 mg via RESPIRATORY_TRACT
  Filled 2012-01-11: qty 2.5

## 2012-01-11 MED ORDER — ALBUTEROL SULFATE (5 MG/ML) 0.5% IN NEBU
INHALATION_SOLUTION | RESPIRATORY_TRACT | Status: AC
  Administered 2012-01-11: 5 mg via RESPIRATORY_TRACT
  Filled 2012-01-11: qty 1

## 2012-01-11 MED ORDER — ALBUTEROL SULFATE (5 MG/ML) 0.5% IN NEBU
5.0000 mg | INHALATION_SOLUTION | Freq: Once | RESPIRATORY_TRACT | Status: AC
  Administered 2012-01-11: 5 mg via RESPIRATORY_TRACT
  Filled 2012-01-11: qty 1

## 2012-01-11 NOTE — ED Notes (Signed)
Pt states she has had body aches, sore throat, N/V, and H/A for 2 days. Vomited x3-4 today.

## 2012-01-11 NOTE — ED Provider Notes (Signed)
History   Scribed for Doug Sou, MD, the patient was seen in MH01/[facility]01. The chart was scribed by Gilman Schmidt. The patients care was started at 6:18 PM.   CSN: [id number]  Arrival date & time 01/11/12  1709   First MD Initiated Contact with Patient 01/11/12 1814      Chief Complaint  Patient presents with  . Emesis    (Consider location/radiation/quality/duration/timing/severity/associated sxs/prior treatment) HPI Stacy Pratt is a 31 y.o. female who presents to the Emergency Department complaining of emesis onset two days. Pt also notes productive cough, sore throat, back pain, abdominal pain, vomiting, and runny nose, generalized body pains, and knee swelling. Denies any diarrhea. Pt has taken Ibuprofen and Tramadol. There are no other associated symptoms and no other alleviating or aggravating factors.    Prescribed by Dr. Bruna Potter  At [facility]  PCP: [facility] Past Medical History  Diagnosis Date  . Seizures     Past Surgical History  Procedure Date  . Mrsa     History reviewed. No pertinent family history.  History  Substance Use Topics  . Smoking status: Current Everyday Smoker -- 0.5 packs/day  . Smokeless tobacco: Not on file  . Alcohol Use: No    OB History    Grav Para Term Preterm Abortions TAB SAB Ect Mult Living                  Review of Systems  Constitutional: Negative for fever.  HENT: Positive for sore throat and rhinorrhea.   Respiratory: Positive for cough.   Gastrointestinal: Positive for vomiting and abdominal pain. Negative for diarrhea.  Musculoskeletal: Positive for back pain and joint swelling.  Psychiatric/Behavioral: The patient is nervous/anxious.   All other systems reviewed and are negative.    Allergies  Penicillins  Home Medications   Current Outpatient Rx  Name Route Sig Dispense Refill  . DIVALPROEX SODIUM 500 MG PO TBEC Oral Take 500-1,000 mg by mouth 2 (two) times daily. Take 1 tab in the morning  and 2 tabs at night    . DULOXETINE HCL 60 MG PO CPEP Oral Take 180 mg by mouth daily.     . IBUPROFEN 200 MG PO TABS Oral Take 800 mg by mouth 2 (two) times daily as needed. For pain    . PREDNISONE 5 MG PO TABS Oral Take 5 mg by mouth daily.    . QUETIAPINE FUMARATE 300 MG PO TABS Oral Take 300 mg by mouth at bedtime.     . TRAMADOL HCL 50 MG PO TABS Oral Take 50 mg by mouth every 6 (six) hours as needed. For pain      BP 116/75  Pulse 99  Temp(Src) 97.6 F (36.4 C) (Oral)  Resp 20  Ht 5\' 1"  (1.549 m)  Wt 190 lb (86.183 kg)  BMI 35.90 kg/m2  SpO2 97%  LMP 01/11/2012  Physical Exam  Nursing note and vitals reviewed. Constitutional: She appears well-developed and well-nourished.  HENT:  Head: Normocephalic and atraumatic.  Mouth/Throat: Posterior oropharyngeal erythema present.  Eyes: Conjunctivae are normal. Pupils are equal, round, and reactive to light.  Neck: Neck supple. No tracheal deviation present. No thyromegaly present.  Cardiovascular: Normal rate and regular rhythm.   No murmur heard. Pulmonary/Chest: Effort normal. She has wheezes (expiratory).  Abdominal: Soft. Bowel sounds are normal. She exhibits no distension. There is no tenderness.       Obese Dime size scab legion at right mid abdomen non tender  Musculoskeletal: Normal range of motion. She exhibits no edema and no tenderness.       No peripheral edema  Extremities w/o eema  Neurological: She is alert. Coordination normal.  Skin: Skin is warm and dry. No rash noted.  Psychiatric: Her mood appears anxious.    ED Course  Procedures (including critical care time)  Labs Reviewed  URINALYSIS, ROUTINE W REFLEX MICROSCOPIC - Abnormal; Notable for the following:    Hgb urine dipstick MODERATE (*)    Leukocytes, UA TRACE (*)    All other components within normal limits  URINE MICROSCOPIC-ADD ON - Abnormal; Notable for the following:    Bacteria, UA FEW (*)    All other components within normal limits    PREGNANCY, URINE   No results found.   No diagnosis found.  DIAGNOSTIC STUDIES: Oxygen Saturation is 98% on room air, normal by my interpretation.    COORDINATION OF CARE: 6:18om:  - Patient evaluated by ED physician, Dilaudid, Proventil, Atrovent, DG Chest, CBC, Diff, CMP, Lipase, Valproic acid, UA, Pregnancy, ordered  Patient has had no episodes of nausea or vomiting while in the emergency department Pain improved after treatment with intravenous narcotics   MDM  Suspect influenza-like illness based on exam Patient reports that tramadol does not adequately help pain Plan prescription hydioocodone -apap Stop ibuprofen or Aleve, as she is taking prednisone chronically and incidents of ulcer disease, emesis and GI bleeds increases with combination of and steroids Diagnosis#1 influenza-like illness #2 vomitting #3 chronic anemia I personally performed the services described in this documentation, which was scribed in my presence. The recorded information has been reviewed and considered.       Doug Sou, MD 01/11/12 2043

## 2012-01-21 ENCOUNTER — Encounter (HOSPITAL_BASED_OUTPATIENT_CLINIC_OR_DEPARTMENT_OTHER): Payer: Self-pay | Admitting: Family Medicine

## 2012-01-21 ENCOUNTER — Emergency Department (HOSPITAL_BASED_OUTPATIENT_CLINIC_OR_DEPARTMENT_OTHER)
Admission: EM | Admit: 2012-01-21 | Discharge: 2012-01-21 | Disposition: A | Discharge status: Home / Self Care | Payer: Medicaid Other | Attending: Emergency Medicine | Admitting: Emergency Medicine

## 2012-01-21 ENCOUNTER — Emergency Department (INDEPENDENT_AMBULATORY_CARE_PROVIDER_SITE_OTHER): Payer: Medicaid Other

## 2012-01-21 DIAGNOSIS — W19XXXA Unspecified fall, initial encounter: Secondary | ICD-10-CM

## 2012-01-21 DIAGNOSIS — F17200 Nicotine dependence, unspecified, uncomplicated: Secondary | ICD-10-CM | POA: Insufficient documentation

## 2012-01-21 DIAGNOSIS — W108XXA Fall (on) (from) other stairs and steps, initial encounter: Secondary | ICD-10-CM | POA: Insufficient documentation

## 2012-01-21 DIAGNOSIS — M8448XA Pathological fracture, other site, initial encounter for fracture: Secondary | ICD-10-CM

## 2012-01-21 DIAGNOSIS — M549 Dorsalgia, unspecified: Secondary | ICD-10-CM | POA: Insufficient documentation

## 2012-01-21 DIAGNOSIS — Z8739 Personal history of other diseases of the musculoskeletal system and connective tissue: Secondary | ICD-10-CM | POA: Insufficient documentation

## 2012-01-21 HISTORY — DX: Unspecified osteoarthritis, unspecified site: M19.90

## 2012-01-21 MED ORDER — ONDANSETRON 8 MG PO TBDP
8.0000 mg | ORAL_TABLET | Freq: Once | ORAL | Status: AC
  Administered 2012-01-21: 8 mg via ORAL
  Filled 2012-01-21: qty 1

## 2012-01-21 MED ORDER — OXYCODONE-ACETAMINOPHEN 5-325 MG PO TABS
2.0000 | ORAL_TABLET | Freq: Once | ORAL | Status: AC
  Administered 2012-01-21: 2 via ORAL
  Filled 2012-01-21: qty 2

## 2012-01-21 MED ORDER — OXYCODONE-ACETAMINOPHEN 5-325 MG PO TABS: 1.0000 | ORAL_TABLET | ORAL | Status: AC | PRN

## 2012-01-21 MED ORDER — KETOROLAC TROMETHAMINE 60 MG/2ML IM SOLN
60.0000 mg | Freq: Once | INTRAMUSCULAR | Status: AC
  Administered 2012-01-21: 60 mg via INTRAMUSCULAR
  Filled 2012-01-21: qty 2

## 2012-01-21 MED ORDER — DIAZEPAM 5 MG PO TABS
5.0000 mg | ORAL_TABLET | Freq: Once | ORAL | Status: AC
  Administered 2012-01-21: 5 mg via ORAL
  Filled 2012-01-21: qty 1

## 2012-01-21 NOTE — ED Notes (Signed)
Pt c/o slipping down stairs last night and injuring right low back. Pt ambulatory into dept.

## 2012-01-21 NOTE — Discharge Instructions (Signed)

## 2012-01-21 NOTE — ED Notes (Signed)
No IV present during this ED visit

## 2012-01-21 NOTE — ED Notes (Signed)
Patient transported to X-ray 

## 2012-01-21 NOTE — ED Provider Notes (Signed)
History     CSN: [id number]  Arrival date & time 01/21/12  4098   First MD Initiated Contact with Patient 01/21/12 1845      Chief Complaint  Patient presents with  . Back Pain     Patient is a 31 y.o. female presenting with back pain. The history is provided by the patient.  Back Pain  This is a new problem. The current episode started 12 to 24 hours ago. The problem occurs constantly. The problem has been gradually worsening. The pain is associated with falling. The pain is present in the lumbar spine. The quality of the pain is described as aching. The pain does not radiate. The pain is moderate. The symptoms are aggravated by bending and twisting. The pain is the same all the time. Pertinent negatives include no chest pain, no abdominal pain, no paresis and no weakness.  Pt reports she was walking down steps, she slipped down 6 steps, and she landed on her back and she reports she hit her head.  No LOC.  No neck pain.  She reports back pain.  She denies abd pain.  She also reports pain in her right leg.  She reports it hurts to walk No vomiting.  No cp/sob   Past Medical History  Diagnosis Date  . Seizures   . Arthritis     Past Surgical History  Procedure Date  . Mrsa     No family history on file.  History  Substance Use Topics  . Smoking status: Current Everyday Smoker -- 0.5 packs/day  . Smokeless tobacco: Not on file  . Alcohol Use: No    OB History    Grav Para Term Preterm Abortions TAB SAB Ect Mult Living                  Review of Systems  Cardiovascular: Negative for chest pain.  Gastrointestinal: Negative for abdominal pain.  Musculoskeletal: Positive for back pain.  Neurological: Negative for weakness.  All other systems reviewed and are negative.    Allergies  Penicillins  Home Medications   Current Outpatient Rx  Name Route Sig Dispense Refill  . DIVALPROEX SODIUM 500 MG PO TBEC Oral Take 500-1,000 mg by mouth 2 (two) times daily. Take  1 tab in the morning and 2 tabs at night    . DULOXETINE HCL 60 MG PO CPEP Oral Take 180 mg by mouth daily.     . IBUPROFEN 200 MG PO TABS Oral Take 800 mg by mouth 2 (two) times daily as needed. For pain    . QUETIAPINE FUMARATE 300 MG PO TABS Oral Take 300 mg by mouth at bedtime.       BP 121/59  Pulse 92  Temp(Src) 97.7 F (36.5 C) (Oral)  Resp 16  SpO2 98%  LMP 01/11/2012  Physical Exam CONSTITUTIONAL: Well developed/well nourished HEAD AND FACE: Normocephalic/atraumatic EYES: EOMI/PERRL ENMT: Mucous membranes moist NECK: supple no meningeal signs SPINE:cervical/thoracic spine nontender.  Lumbar spine tender, No bruising/crepitance/stepoffs noted to spine CV: S1/S2 noted, no murmurs/rubs/gallops noted LUNGS: Lungs are clear to auscultation bilaterally, no apparent distress ABDOMEN: soft, nontender, no rebound or guarding GU:no cva tenderness NEURO: Pt is awake/alert, moves all extremitiesx4, no focal motor deficits noted in the lower extremities EXTREMITIES: pulses normal, full ROM.  Bruising noted along right thigh but no tenderness to ROM of right hip All other extremities/joints palpated/ranged and nontender SKIN: warm, color normal PSYCH: no abnormalities of mood noted  ED Course  Procedures   Pt ambulatory, pain improved Stable for d/c  The patient appears reasonably screened and/or stabilized for discharge and I doubt any other medical condition or other [facility] requiring further screening, evaluation, or treatment in the ED at this time prior to discharge.   MDM  Nursing notes reviewed and considered in documentation xrays reviewed and considered Previous records reviewed and considered Narcotic database reviewed        Joya Gaskins, MD 01/21/12 2105

## 2012-02-10 ENCOUNTER — Emergency Department (HOSPITAL_BASED_OUTPATIENT_CLINIC_OR_DEPARTMENT_OTHER)
Admission: EM | Admit: 2012-02-10 | Discharge: 2012-02-10 | Disposition: A | Discharge status: Home / Self Care | Payer: Medicaid Other

## 2012-02-10 NOTE — ED Notes (Signed)
Registration clerk states that pt was seen ambulating out of dept to her car with a steady gait. Will wait to see if pt returns for further treatment.

## 2012-02-10 NOTE — ED Notes (Signed)
Pt not available in the waiting room after mutliple calls.

## 2012-02-10 NOTE — ED Notes (Signed)
Called to front lobby by registration to assess pt stating that she had just had a seizure. Pt had slid from w/c into floor and then was assisted back into chair. Pt is not post-ictal, no incontinence noted, pt is alert and oriented x4, denies any injury. Pt is here c/o tooth pain and was also treated at [facility] last night for the same. Pt stating that she needs stronger meds for her tooth pain because no narcotics were given last night.

## 2012-02-10 NOTE — ED Notes (Signed)
The patient has been called for the third time. There is no answer from the patient again.

## 2012-03-21 ENCOUNTER — Emergency Department (HOSPITAL_BASED_OUTPATIENT_CLINIC_OR_DEPARTMENT_OTHER)
Admission: EM | Admit: 2012-03-21 | Discharge: 2012-03-21 | Disposition: A | Discharge status: Home / Self Care | Payer: Medicaid Other | Attending: Emergency Medicine | Admitting: Emergency Medicine

## 2012-03-21 ENCOUNTER — Encounter (HOSPITAL_BASED_OUTPATIENT_CLINIC_OR_DEPARTMENT_OTHER): Payer: Self-pay | Admitting: *Deleted

## 2012-03-21 DIAGNOSIS — M2550 Pain in unspecified joint: Secondary | ICD-10-CM | POA: Insufficient documentation

## 2012-03-21 DIAGNOSIS — F17200 Nicotine dependence, unspecified, uncomplicated: Secondary | ICD-10-CM | POA: Insufficient documentation

## 2012-03-21 DIAGNOSIS — R52 Pain, unspecified: Secondary | ICD-10-CM

## 2012-03-21 MED ORDER — KETOROLAC TROMETHAMINE 60 MG/2ML IM SOLN
60.0000 mg | Freq: Once | INTRAMUSCULAR | Status: AC
  Administered 2012-03-21: 60 mg via INTRAMUSCULAR
  Filled 2012-03-21: qty 2

## 2012-03-21 MED ORDER — OXYCODONE-ACETAMINOPHEN 5-325 MG PO TABS: 2.0000 | ORAL_TABLET | ORAL | Status: AC | PRN

## 2012-03-21 MED ORDER — OXYCODONE-ACETAMINOPHEN 5-325 MG PO TABS
2.0000 | ORAL_TABLET | Freq: Once | ORAL | Status: AC
  Administered 2012-03-21: 2 via ORAL
  Filled 2012-03-21: qty 2

## 2012-03-21 NOTE — Discharge Instructions (Signed)
Musculoskeletal Pain   Musculoskeletal pain is muscle and boney aches and pains. These pains can occur in any part of the body. Your caregiver may treat you without knowing the cause of the pain. They may treat you if blood or urine tests, X-rays, and other tests were normal.   CAUSES   There is often not a definite cause or reason for these pains. These pains may be caused by a type of germ (virus). The discomfort may also come from overuse. Overuse includes working out too hard when your body is not fit. Boney aches also come from weather changes. Bone is sensitive to atmospheric pressure changes.   HOME CARE INSTRUCTIONS   Ask when your test results will be ready. Make sure you get your test results.   Only take over-the-counter or prescription medicines for pain, discomfort, or fever as directed by your caregiver. If you were given medications for your condition, do not drive, operate machinery or power tools, or sign legal documents for 24 hours. Do not drink alcohol. Do not take sleeping pills or other medications that may interfere with treatment.   Continue all activities unless the activities cause more pain. When the pain lessens, slowly resume normal activities. Gradually increase the intensity and duration of the activities or exercise.   During periods of severe pain, bed rest may be helpful. Lay or sit in any position that is comfortable.   Putting ice on the injured area.   Put ice in a bag.   Place a towel between your skin and the bag.   Leave the ice on for 15 to 20 minutes, 3 to 4 times a day.   Follow up with your caregiver for continued problems and no reason can be found for the pain. If the pain becomes worse or does not go away, it may be necessary to repeat tests or do additional testing. Your caregiver may need to look further for a possible cause.   SEEK IMMEDIATE MEDICAL CARE IF:   You have pain that is getting worse and is not relieved by medications.   You develop chest pain that is  associated with shortness or breath, sweating, feeling sick to your stomach (nauseous), or throw up (vomit).   Your pain becomes localized to the abdomen.   You develop any new symptoms that seem different or that concern you.   MAKE SURE YOU:   Understand these instructions.   Will watch your condition.   Will get help right away if you are not doing well or get worse.   Document Released: 11/10/2005 Document Revised: 10/30/2011 Document Reviewed: 06/30/2008   [employer]® Patient Information ©2012 [employer].

## 2012-03-21 NOTE — ED Notes (Signed)
Pt states she got up this morning with joint pain. Hx same 1 yr ago. Could not stand up.

## 2012-03-21 NOTE — ED Provider Notes (Signed)
Medical screening examination/treatment/procedure(s) were performed by non-physician practitioner and as supervising physician I was immediately available for consultation/collaboration.  Caidynce Muzyka, MD 03/21/12 2159 

## 2012-03-21 NOTE — ED Provider Notes (Signed)
History     CSN: [id number]  Arrival date & time 03/21/12  1428   First MD Initiated Contact with Patient 03/21/12 1544      Chief Complaint  Patient presents with  . Joint Pain    (Consider location/radiation/quality/duration/timing/severity/associated sxs/prior treatment) HPI Comments: Pt is c/o joint pain that started this morning:pt states that she has history of similar symptoms over the last year:pt states that she was diagnosed with arthritis and she is supposed to see "a specialist" in the next two weeks:pt denies fever or swelling to her joints  The history is provided by the patient. No language interpreter was used.    Past Medical History  Diagnosis Date  . Seizures   . Arthritis     Past Surgical History  Procedure Date  . Mrsa     History reviewed. No pertinent family history.  History  Substance Use Topics  . Smoking status: Current Everyday Smoker -- 0.5 packs/day  . Smokeless tobacco: Not on file  . Alcohol Use: No    OB History    Grav Para Term Preterm Abortions TAB SAB Ect Mult Living                  Review of Systems  Constitutional: Negative.   Respiratory: Negative.   Cardiovascular: Negative.   Genitourinary: Negative.   Neurological: Negative.     Allergies  Penicillins  Home Medications   Current Outpatient Rx  Name Route Sig Dispense Refill  . DIVALPROEX SODIUM 500 MG PO TBEC Oral Take 500-1,000 mg by mouth 2 (two) times daily. Take 1 tab in the morning and 2 tabs at night    . DULOXETINE HCL 60 MG PO CPEP Oral Take 180 mg by mouth daily.     . IBUPROFEN 200 MG PO TABS Oral Take 800 mg by mouth 2 (two) times daily as needed. Patient used this medication for her body pain.    Marland Kitchen QUETIAPINE FUMARATE 300 MG PO TABS Oral Take 300 mg by mouth at bedtime.       BP 115/65  Pulse 100  Temp(Src) 97.8 F (36.6 C) (Oral)  Resp 20  Ht 5\' 1"  (1.549 m)  Wt 190 lb (86.183 kg)  BMI 35.90 kg/m2  SpO2 100%  LMP  03/19/2012  Physical Exam  Nursing note and vitals reviewed. Constitutional: She is oriented to person, place, and time. She appears well-developed and well-nourished.  HENT:  Head: Normocephalic and atraumatic.  Eyes: EOM are normal.  Neck: Neck supple.  Cardiovascular: Normal rate and regular rhythm.   Pulmonary/Chest: Effort normal and breath sounds normal.  Musculoskeletal:       Pt has full rom of extremities:pulses intact:no swelling noted:pt has no redness or warmth noted   Neurological: She is alert and oriented to person, place, and time.  Skin: Skin is warm and dry.  Psychiatric: She has a normal mood and affect.    ED Course  Procedures (including critical care time)  Labs Reviewed - No data to display No results found.   1. Pain       MDM  Pt feeling better after toradol and percocet:pt is able to ambulate without any problem:don't think imaging or lab work is needed at this time        Teressa Lower, NP 03/21/12 1702

## 2012-09-13 ENCOUNTER — Encounter (HOSPITAL_BASED_OUTPATIENT_CLINIC_OR_DEPARTMENT_OTHER): Payer: Self-pay | Admitting: Emergency Medicine

## 2012-09-13 ENCOUNTER — Emergency Department (HOSPITAL_BASED_OUTPATIENT_CLINIC_OR_DEPARTMENT_OTHER)
Admission: EM | Admit: 2012-09-13 | Discharge: 2012-09-13 | Disposition: A | Discharge status: Home / Self Care | Payer: Medicaid Other | Attending: Emergency Medicine | Admitting: Emergency Medicine

## 2012-09-13 DIAGNOSIS — F17200 Nicotine dependence, unspecified, uncomplicated: Secondary | ICD-10-CM | POA: Insufficient documentation

## 2012-09-13 DIAGNOSIS — Z8739 Personal history of other diseases of the musculoskeletal system and connective tissue: Secondary | ICD-10-CM | POA: Insufficient documentation

## 2012-09-13 DIAGNOSIS — G40909 Epilepsy, unspecified, not intractable, without status epilepticus: Secondary | ICD-10-CM | POA: Insufficient documentation

## 2012-09-13 DIAGNOSIS — Z79899 Other long term (current) drug therapy: Secondary | ICD-10-CM | POA: Insufficient documentation

## 2012-09-13 DIAGNOSIS — B029 Zoster without complications: Secondary | ICD-10-CM | POA: Insufficient documentation

## 2012-09-13 MED ORDER — ACYCLOVIR 800 MG PO TABS: ORAL_TABLET | ORAL | Status: DC

## 2012-09-13 MED ORDER — ACYCLOVIR 200 MG PO CAPS
800.0000 mg | ORAL_CAPSULE | Freq: Once | ORAL | Status: AC
  Administered 2012-09-13: 800 mg via ORAL
  Filled 2012-09-13: qty 4

## 2012-09-13 MED ORDER — ONDANSETRON HCL 4 MG/2ML IJ SOLN
4.0000 mg | Freq: Once | INTRAMUSCULAR | Status: AC
  Administered 2012-09-13: 4 mg via INTRAMUSCULAR
  Filled 2012-09-13: qty 2

## 2012-09-13 MED ORDER — OXYCODONE-ACETAMINOPHEN 5-325 MG PO TABS: 1.0000 | ORAL_TABLET | ORAL | Status: DC | PRN

## 2012-09-13 MED ORDER — HYDROMORPHONE HCL PF 2 MG/ML IJ SOLN
2.0000 mg | Freq: Once | INTRAMUSCULAR | Status: AC
  Administered 2012-09-13: 2 mg via INTRAMUSCULAR

## 2012-09-13 MED ORDER — HYDROMORPHONE HCL PF 2 MG/ML IJ SOLN
2.0000 mg | Freq: Once | INTRAMUSCULAR | Status: DC
  Filled 2012-09-13: qty 1

## 2012-09-13 NOTE — ED Notes (Signed)
Dr. Davidson at bedside.

## 2012-09-13 NOTE — ED Provider Notes (Signed)
History   This chart was scribed for Carleene Cooper III, MD by Sofie Rower. The patient was seen in room MH04/MH04 and the patient's care was started at 10:08PM.     CSN: [id number]  Arrival date & time 09/13/12  2028   First MD Initiated Contact with Patient 09/13/12 2208      Chief Complaint  Patient presents with  . Rash    (Consider location/radiation/quality/duration/timing/severity/associated sxs/prior treatment) Patient is a 31 y.o. female presenting with rash. The history is provided by the patient. No language interpreter was used.  Rash  This is a new problem. The current episode started 2 days ago. The problem has been gradually worsening. The problem is associated with an unknown factor. There has been no fever. The rash is present on the back, abdomen and groin. The pain is moderate. The pain has been constant since onset. Associated symptoms include itching and pain. She has tried nothing for the symptoms. The treatment provided no relief.    Stacy Pratt is a 31 y.o. female , with a hx of MRSA, who presents to the Emergency Department complaining of sudden, progressively worsening, rash, located on the back, abdomen, and inner thighs bilaterally, onset two days ago (09/11/12).  Associated symptoms include itchiness located at the back and abdomen. The pt reports she is experiencing an extremely painful rash which began two days ago, causing her an inability to sleep while lying flat on her back at night. Modifying factors include lying down in certain positions which intensifies the pain from the rash. The pt has a hx of drainage of abscess located at the left groin and underneath her left breast.   The pt denies any fever associated with the rash.   The pt is a current everyday smoker (0.5 packs/day), however, she does not drink alcohol.    Pt does not have a PCP.    Past Medical History  Diagnosis Date  . Seizures   . Arthritis     Past Surgical History  Procedure  Date  . Mrsa     No family history on file.  History  Substance Use Topics  . Smoking status: Current Every Day Smoker -- 0.5 packs/day  . Smokeless tobacco: Not on file  . Alcohol Use: No    OB History    Grav Para Term Preterm Abortions TAB SAB Ect Mult Living                  Review of Systems  Skin: Positive for itching and rash.  All other systems reviewed and are negative.    Allergies  Penicillins and Toradol  Home Medications   Current Outpatient Rx  Name Route Sig Dispense Refill  . DIVALPROEX SODIUM 500 MG PO TBEC Oral Take 500-1,000 mg by mouth 2 (two) times daily. Take 1 tab in the morning and 2 tabs at night    . DULOXETINE HCL 60 MG PO CPEP Oral Take 180 mg by mouth daily.     . IBUPROFEN 200 MG PO TABS Oral Take 800 mg by mouth 2 (two) times daily as needed. Patient used this medication for her body pain.    Marland Kitchen QUETIAPINE FUMARATE 300 MG PO TABS Oral Take 300 mg by mouth at bedtime.       BP 122/75  Pulse 100  Temp 98.1 F (36.7 C) (Oral)  Resp 20  Ht 5\' 1"  (1.549 m)  Wt 198 lb (89.812 kg)  BMI 37.41 kg/m2  SpO2 99%  LMP 08/30/2012  Physical Exam  Nursing note and vitals reviewed. Constitutional: She is oriented to person, place, and time. She appears well-developed and well-nourished.       Pt is morbidly obese.   HENT:  Head: Atraumatic.  Nose: Nose normal.  Eyes: Conjunctivae normal and EOM are normal.  Neck: Normal range of motion.  Musculoskeletal: Normal range of motion.  Neurological: She is alert and oriented to person, place, and time.  Skin: Skin is warm and dry. Rash noted.       Fine vesicular rash that goes from the midline of the left lumbar region, extending to the abdomen and left medial thigh.   Psychiatric: She has a normal mood and affect. Her behavior is normal.    ED Course  Procedures (including critical care time)  DIAGNOSTIC STUDIES: Oxygen Saturation is 99% on room air, normal by my interpretation.     COORDINATION OF CARE:  10:14 PM- Treatment plan concerning management of shingles, pain management, and antiviral medication discussed with patient. Pt agrees with treatment.           1. Herpes zoster     I personally performed the services described in this documentation, which was scribed in my presence. The recorded information has been reviewed and considered.  Osvaldo Human, MD     Carleene Cooper III, MD 09/13/12 2229

## 2012-09-13 NOTE — ED Notes (Signed)
Diffuse Red Rash on back and abdomen x2 days.  Burns and itches. Pt reports some drainage earlier.

## 2012-09-25 ENCOUNTER — Emergency Department (HOSPITAL_BASED_OUTPATIENT_CLINIC_OR_DEPARTMENT_OTHER): Payer: Medicaid Other

## 2012-09-25 ENCOUNTER — Encounter (HOSPITAL_BASED_OUTPATIENT_CLINIC_OR_DEPARTMENT_OTHER): Payer: Self-pay | Admitting: *Deleted

## 2012-09-25 ENCOUNTER — Emergency Department (HOSPITAL_BASED_OUTPATIENT_CLINIC_OR_DEPARTMENT_OTHER)
Admission: EM | Admit: 2012-09-25 | Discharge: 2012-09-25 | Disposition: A | Discharge status: Home / Self Care | Payer: Medicaid Other | Attending: Emergency Medicine | Admitting: Emergency Medicine

## 2012-09-25 DIAGNOSIS — W1789XA Other fall from one level to another, initial encounter: Secondary | ICD-10-CM | POA: Insufficient documentation

## 2012-09-25 DIAGNOSIS — M25569 Pain in unspecified knee: Secondary | ICD-10-CM

## 2012-09-25 DIAGNOSIS — Y92009 Unspecified place in unspecified non-institutional (private) residence as the place of occurrence of the external cause: Secondary | ICD-10-CM | POA: Insufficient documentation

## 2012-09-25 DIAGNOSIS — Z880 Allergy status to penicillin: Secondary | ICD-10-CM | POA: Insufficient documentation

## 2012-09-25 DIAGNOSIS — F17200 Nicotine dependence, unspecified, uncomplicated: Secondary | ICD-10-CM | POA: Insufficient documentation

## 2012-09-25 DIAGNOSIS — Z79899 Other long term (current) drug therapy: Secondary | ICD-10-CM | POA: Insufficient documentation

## 2012-09-25 DIAGNOSIS — W19XXXA Unspecified fall, initial encounter: Secondary | ICD-10-CM

## 2012-09-25 DIAGNOSIS — Z888 Allergy status to other drugs, medicaments and biological substances status: Secondary | ICD-10-CM | POA: Insufficient documentation

## 2012-09-25 DIAGNOSIS — S8990XA Unspecified injury of unspecified lower leg, initial encounter: Secondary | ICD-10-CM | POA: Insufficient documentation

## 2012-09-25 DIAGNOSIS — Y9389 Activity, other specified: Secondary | ICD-10-CM | POA: Insufficient documentation

## 2012-09-25 MED ORDER — OXYCODONE-ACETAMINOPHEN 5-325 MG PO TABS: 1.0000 | ORAL_TABLET | ORAL | Status: DC | PRN

## 2012-09-25 MED ORDER — NAPROXEN 250 MG PO TABS
500.0000 mg | ORAL_TABLET | Freq: Once | ORAL | Status: AC
  Administered 2012-09-25: 500 mg via ORAL
  Filled 2012-09-25: qty 2

## 2012-09-25 MED ORDER — HYDROMORPHONE HCL PF 1 MG/ML IJ SOLN
0.5000 mg | Freq: Once | INTRAMUSCULAR | Status: AC
  Administered 2012-09-25: 0.5 mg via INTRAMUSCULAR
  Filled 2012-09-25: qty 1

## 2012-09-25 MED ORDER — HYDROCODONE-ACETAMINOPHEN 5-325 MG PO TABS
2.0000 | ORAL_TABLET | Freq: Once | ORAL | Status: AC
  Administered 2012-09-25: 2 via ORAL
  Filled 2012-09-25: qty 2

## 2012-09-25 NOTE — ED Notes (Signed)
MD at bedside. 

## 2012-09-25 NOTE — Progress Notes (Signed)
On arrival to patients room for ace wrapping and crutch instruction, patient requesting pain medication. Patient dresses herself without any difficulty, using full range of motion in affected extremity to put on pants, socks, and shoes. No difficulty with ambulation noted. Patients crutches fitted appropriately for height and instructions for use given . Patient again requesting additional pain medicine.

## 2012-09-25 NOTE — ED Notes (Signed)
Pt presents to ED today with bilateral knee pain after slipping on bricks and landing on knees.  Pt took no otc meds pta

## 2012-09-25 NOTE — ED Notes (Addendum)
Called to room. Pt states that she is still hurting. She also states that "the last time I was here, they gave me a shot of Dilaudid which immediately took the pain away." Dr. Rulon Abide advised.

## 2012-09-25 NOTE — ED Provider Notes (Signed)
History  This chart was scribed for Jones Skene, MD by Shari Heritage and Marlin Canary. The patient was seen in room MH05/MH05. Patient's care was started at 1542.  CSN: [id number]  Arrival date & time 09/25/12  1442   First MD Initiated Contact with Patient 09/25/12 1542      Chief Complaint  Patient presents with  . Knee Pain     The history is provided by the patient. No language interpreter was used.    Stacy Pratt is a 31 y.o. female who presents to the Emergency Department complaining of constant, moderate to severe, bilateral knee pain that radiates down to her toes onset 2 hours ago due to a fall. Patient states that she slipped on [place] and [place]. Patient says that she has exacerbated some soreness due to a bilateral arthroscopy she had done 3 weeks ago. Patient rates pain as 7/10, but it is worse in her left knee. Patient denies HA, changes in vision or LOC. She denies any head injury during the fall. She denies taking any pain medication at home, but states that she got a shot of Dilaudid for pain last time she was here. Patient denies any history of diabetes or heart disease. She has a history of epilepsy and states that she had a seizure 2 weeks ago after she ran out of Klonopin. Other medical history includes arthritis. Patient smokes.  Orthopedist - Dr. Juel Burrow of [facility]   Past Medical History  Diagnosis Date  . Seizures   . Arthritis     Past Surgical History  Procedure Date  . Mrsa     History reviewed. No pertinent family history.  History  Substance Use Topics  . Smoking status: Current Every Day Smoker -- 0.5 packs/day  . Smokeless tobacco: Not on file  . Alcohol Use: No    OB History    Grav Para Term Preterm Abortions TAB SAB Ect Mult Living                  Review of Systems At least 10pt or greater review of systems completed and are negative except where specified in the HPI.  Allergies  Penicillins and  Toradol  Home Medications   Current Outpatient Rx  Name Route Sig Dispense Refill  . ACYCLOVIR 800 MG PO TABS  Take one tablet five times per day for seven days. 35 tablet 0  . DIVALPROEX SODIUM 500 MG PO TBEC Oral Take 500-1,000 mg by mouth 2 (two) times daily. Take 1 tab in the morning and 2 tabs at night    . DULOXETINE HCL 60 MG PO CPEP Oral Take 180 mg by mouth daily.     . IBUPROFEN 200 MG PO TABS Oral Take 800 mg by mouth 2 (two) times daily as needed. Patient used this medication for her body pain.    . OXYCODONE-ACETAMINOPHEN 5-325 MG PO TABS Oral Take 1 tablet by mouth every 4 (four) hours as needed for pain. 20 tablet 0  . QUETIAPINE FUMARATE 300 MG PO TABS Oral Take 300 mg by mouth at bedtime.       BP 118/66  Pulse 93  Temp 98.8 F (37.1 C)  Resp 20  Ht 5\' 1"  (1.549 m)  Wt 190 lb (86.183 kg)  BMI 35.90 kg/m2  SpO2 99%  LMP 08/30/2012  Physical Exam   Nursing notes reviewed.  Electronic medical record reviewed. VITAL SIGNS:   Filed Vitals:   09/25/12  1454 09/25/12 1658  BP: 118/66 105/69  Pulse: 93 68  Temp: 98.8 F (37.1 C) 98.2 F (36.8 C)  TempSrc:  Oral  Resp: 20 20  Height: 5\' 1"  (1.549 m)   Weight: 190 lb (86.183 kg)   SpO2: 99% 99%   CONSTITUTIONAL: Awake, oriented, appears non-toxic HENT: Atraumatic, normocephalic, oral mucosa pink and moist, airway patent. Nares patent without drainage. External ears normal. EYES: Conjunctiva clear, EOMI, PERRLA NECK: Trachea midline, non-tender, supple CARDIOVASCULAR: Normal heart rate, Normal rhythm, No murmurs, rubs, gallops PULMONARY/CHEST: Clear to auscultation, no rhonchi, wheezes, or rales. Symmetrical breath sounds. Non-tender. ABDOMINAL: Non-distended, soft, non-tender - no rebound or guarding.  BS normal. NEUROLOGIC: Non-focal, moving all four extremities, no gross sensory or motor deficits. EXTREMITIES: No clubbing, cyanosis, or edema. Mild left knee pre-patellar effusion.  Some TTP over patella.  No  joint line tenderness.  Joint appears stable, though patient exam is limited secondary to pain.  No gross swelling or discoloration. SKIN: Warm, Dry, No erythema, No rash 7 cm abrasion to left anterior lateral thigh.   ED Course  Procedures (including critical care time) DIAGNOSTIC STUDIES: Oxygen Saturation is 99% on room air. normal by my interpretation.    COORDINATION OF CARE: 4:20pm- Patient informed of current plan for treatment and evaluation and agrees with plan at this time.     Labs Reviewed - No data to display  Dg Knee 2 Views Left  09/25/2012  *RADIOLOGY REPORT*  Clinical Data: Larey Seat.  Injured knee.  LEFT KNEE - 1-2 VIEW  Comparison: None  Findings: There are medial and lateral compartment degenerative changes with joint space narrowing and early spurring.  No fracture or osteochondral lesion.  A small to moderate sized suprapatellar knee joint effusion is noted.  IMPRESSION:  1.  Medial and lateral compartment degenerative changes. 2.  No acute fracture. 3.  Moderate sized joint effusion.   Original Report Authenticated By: Rudie Meyer, M.D.      1. Fall   2. Knee pain       MDM  Stacy Pratt is a 31 y.o. female fell and is having knee pain.  Suspicious behavior for drug seeking - though she does have a small knee effusion and scrapes on her thigh.  Percocet given and XR obtained.  Negative XR.  Conservative measure with RICE therapy and crutches.  F/U with orthopedist.  I explained the diagnosis and have given explicit precautions to return to the ER including any other new or worsening symptoms. The patient understands and accepts the medical plan as it's been dictated and I have answered their questions. Discharge instructions concerning home care and prescriptions have been given.  The patient is STABLE and is discharged to home in good condition.   I personally performed the services described in this documentation, which was scribed in my presence. The recorded  information has been reviewed and considered. Jones Skene, M.D.        Jones Skene, MD 09/27/12 1353

## 2012-09-25 NOTE — ED Notes (Signed)
Resting quietly. Eyes closed.

## 2012-10-22 ENCOUNTER — Emergency Department (HOSPITAL_BASED_OUTPATIENT_CLINIC_OR_DEPARTMENT_OTHER)
Admission: EM | Admit: 2012-10-22 | Discharge: 2012-10-23 | Disposition: A | Discharge status: Home / Self Care | Payer: Medicaid Other | Attending: Emergency Medicine | Admitting: Emergency Medicine

## 2012-10-22 ENCOUNTER — Encounter (HOSPITAL_BASED_OUTPATIENT_CLINIC_OR_DEPARTMENT_OTHER): Payer: Self-pay

## 2012-10-22 DIAGNOSIS — L02219 Cutaneous abscess of trunk, unspecified: Secondary | ICD-10-CM | POA: Insufficient documentation

## 2012-10-22 DIAGNOSIS — M1990 Unspecified osteoarthritis, unspecified site: Secondary | ICD-10-CM | POA: Insufficient documentation

## 2012-10-22 DIAGNOSIS — Z7982 Long term (current) use of aspirin: Secondary | ICD-10-CM | POA: Insufficient documentation

## 2012-10-22 DIAGNOSIS — Z79899 Other long term (current) drug therapy: Secondary | ICD-10-CM | POA: Insufficient documentation

## 2012-10-22 DIAGNOSIS — G40909 Epilepsy, unspecified, not intractable, without status epilepticus: Secondary | ICD-10-CM | POA: Insufficient documentation

## 2012-10-22 DIAGNOSIS — L989 Disorder of the skin and subcutaneous tissue, unspecified: Secondary | ICD-10-CM

## 2012-10-22 DIAGNOSIS — F17200 Nicotine dependence, unspecified, uncomplicated: Secondary | ICD-10-CM | POA: Insufficient documentation

## 2012-10-22 DIAGNOSIS — Z8614 Personal history of Methicillin resistant Staphylococcus aureus infection: Secondary | ICD-10-CM | POA: Insufficient documentation

## 2012-10-22 DIAGNOSIS — L02214 Cutaneous abscess of groin: Secondary | ICD-10-CM

## 2012-10-22 HISTORY — DX: Methicillin resistant Staphylococcus aureus infection, unspecified site: A49.02

## 2012-10-22 HISTORY — DX: Unspecified osteoarthritis, unspecified site: M19.90

## 2012-10-22 MED ORDER — LIDOCAINE HCL 2 % EX GEL
CUTANEOUS | Status: DC | PRN
  Filled 2012-10-22: qty 20

## 2012-10-22 MED ORDER — LIDOCAINE-EPINEPHRINE 2 %-1:100000 IJ SOLN
INTRAMUSCULAR | Status: AC
  Filled 2012-10-22: qty 1

## 2012-10-22 NOTE — ED Notes (Signed)
Noted open area bottom of vaginal area,small amt of drainage noted d

## 2012-10-22 NOTE — ED Provider Notes (Signed)
History     CSN: [id number]  Arrival date & time 10/22/12  2252   First MD Initiated Contact with Patient 10/22/12 2351      Chief Complaint  Patient presents with  . Abscess    (Consider location/radiation/quality/duration/timing/severity/associated sxs/prior treatment) HPI Is a 31 year old female with 34 day history of a small abscess in her right groin fold. She tried opening it with a safety pin earlier today and expressed pus out of it. She states it is moderately painful. She is also complaining of a "boil" just posterior to her vulva. She states this is severely painful and burns when she urinates. It is also worse with ambulation.  Past Medical History  Diagnosis Date  . Seizures   . Arthritis   . MRSA (methicillin resistant Staphylococcus aureus)   . Osteoarthritis     Past Surgical History  Procedure Date  . Mrsa   . Abdominal surgery     No family history on file.  History  Substance Use Topics  . Smoking status: Current Every Day Smoker -- 0.5 packs/day  . Smokeless tobacco: Not on file  . Alcohol Use: No    OB History    Grav Para Term Preterm Abortions TAB SAB Ect Mult Living                  Review of Systems  All other systems reviewed and are negative.    Allergies  Penicillins and Toradol  Home Medications   Current Outpatient Rx  Name  Route  Sig  Dispense  Refill  . ASPIRIN PO   Oral   Take by mouth.         Marland Kitchen NAPROXEN SODIUM 220 MG PO TABS   Oral   Take 220 mg by mouth 2 (two) times daily with a meal.         . SULFAMETHOXAZOLE-TMP DS 800-160 MG PO TABS   Oral   Take 1 tablet by mouth 2 (two) times daily.         . ACYCLOVIR 800 MG PO TABS      Take one tablet five times per day for seven days.   35 tablet   0   . DIVALPROEX SODIUM 500 MG PO TBEC   Oral   Take 500-1,000 mg by mouth 2 (two) times daily. Take 1 tab in the morning and 2 tabs at night         . DULOXETINE HCL 60 MG PO CPEP   Oral   Take 180  mg by mouth daily.          . IBUPROFEN 200 MG PO TABS   Oral   Take 800 mg by mouth 2 (two) times daily as needed. Patient used this medication for her body pain.         . OXYCODONE-ACETAMINOPHEN 5-325 MG PO TABS   Oral   Take 1-2 tablets by mouth every 4 (four) hours as needed for pain.   13 tablet   0   . OXYCODONE-ACETAMINOPHEN 5-325 MG PO TABS   Oral   Take 1 tablet by mouth every 4 (four) hours as needed for pain.   20 tablet   0   . QUETIAPINE FUMARATE 300 MG PO TABS   Oral   Take 300 mg by mouth at bedtime.            BP 134/83  Pulse 105  Temp 98.3 F (36.8 C) (Oral)  Resp 20  Ht 5'  1" (1.549 m)  Wt 205 lb (92.987 kg)  BMI 38.73 kg/m2  SpO2 96%  LMP 10/19/2012  Physical Exam General: Well-developed, well-nourished female in no acute distress; appearance consistent with age of record HENT: normocephalic, atraumatic; edentulous Eyes: Normal appearance Neck: supple Heart: regular rate and rhythm Lungs: Normal respiratory effort and excursion Abdomen: soft; nondistended Extremities: No deformity; full range of motion GU: Normal external genitalia; tampon in the vagina; tender, excoriated skin lesion posterior to vulva Neurologic: Awake, alert and oriented; motor function intact in all extremities and symmetric; no facial droop; dysarthria Skin: Warm and dry; small abscessed right groin fold without fluctuance, central punctum consistent with patient self treatment Psychiatric: Constantly requesting narcotic pain medicine    ED Course  Procedures (including critical care time)  INCISION AND DRAINAGE Performed by: Paula Libra L Consent: Verbal consent obtained. Risks and benefits: risks, benefits and alternatives were discussed Type: abscess  Body area: Right groin fold  Anesthesia: local infiltration  Incision was made with a scalpel.  Local anesthetic: lidocaine 2 % with epinephrine  Anesthetic total: 1 ml  Complexity: complex Blunt  dissection to break up loculations  Drainage: Sanguinous   Drainage amount: Scant   Packing material: 1/4 in iodoform gauze  Patient tolerance: Patient tolerated the procedure well with no immediate complications.      MDM  The patient has been requesting narcotic pain medicine almost constantly. She has been at least 1 dozen times and has in addition asked her triage nurse and staff nurse for narcotic pain medication. The patient was advised that she is followed up already for chronic pain and receives 120 hydrocodone tablets a month. We will not be adding any narcotic to that regimen. We will provide topical pain management for her groin lesion.        Hanley Seamen, MD 10/22/12 [phone]

## 2012-10-22 NOTE — ED Notes (Signed)
C/o "2 boils" to vaginal area x 3-4 days-pt states she stuck a needle in it pTA

## 2012-10-23 MED ORDER — MUPIROCIN CALCIUM 2 % EX CREA: TOPICAL_CREAM | Freq: Three times a day (TID) | CUTANEOUS | Status: DC

## 2012-10-23 NOTE — ED Notes (Signed)
Pt given lidocaine and Bactroban to take home per MD

## 2012-10-23 NOTE — ED Notes (Signed)
Bole to right side of peri area open and packed,instructed pt per MD to take packing out in 2 days if not fallen out.Pt verbalized understanding.

## 2012-11-18 ENCOUNTER — Emergency Department (HOSPITAL_BASED_OUTPATIENT_CLINIC_OR_DEPARTMENT_OTHER)
Admission: EM | Admit: 2012-11-18 | Discharge: 2012-11-19 | Disposition: A | Discharge status: Home / Self Care | Payer: Medicaid Other | Attending: Emergency Medicine | Admitting: Emergency Medicine

## 2012-11-18 ENCOUNTER — Encounter (HOSPITAL_BASED_OUTPATIENT_CLINIC_OR_DEPARTMENT_OTHER): Payer: Self-pay | Admitting: *Deleted

## 2012-11-18 DIAGNOSIS — M1990 Unspecified osteoarthritis, unspecified site: Secondary | ICD-10-CM | POA: Insufficient documentation

## 2012-11-18 DIAGNOSIS — Z8614 Personal history of Methicillin resistant Staphylococcus aureus infection: Secondary | ICD-10-CM | POA: Insufficient documentation

## 2012-11-18 DIAGNOSIS — Z7982 Long term (current) use of aspirin: Secondary | ICD-10-CM | POA: Insufficient documentation

## 2012-11-18 DIAGNOSIS — F17200 Nicotine dependence, unspecified, uncomplicated: Secondary | ICD-10-CM | POA: Insufficient documentation

## 2012-11-18 DIAGNOSIS — L02219 Cutaneous abscess of trunk, unspecified: Secondary | ICD-10-CM | POA: Insufficient documentation

## 2012-11-18 DIAGNOSIS — G40909 Epilepsy, unspecified, not intractable, without status epilepticus: Secondary | ICD-10-CM | POA: Insufficient documentation

## 2012-11-18 DIAGNOSIS — Z79899 Other long term (current) drug therapy: Secondary | ICD-10-CM | POA: Insufficient documentation

## 2012-11-18 DIAGNOSIS — L0291 Cutaneous abscess, unspecified: Secondary | ICD-10-CM

## 2012-11-18 MED ORDER — LIDOCAINE HCL 2 % IJ SOLN
INTRAMUSCULAR | Status: AC
  Administered 2012-11-18: 400 mg
  Filled 2012-11-18: qty 20

## 2012-11-18 MED ORDER — TRAMADOL HCL 50 MG PO TABS
50.0000 mg | ORAL_TABLET | Freq: Once | ORAL | Status: AC
  Administered 2012-11-18: 50 mg via ORAL
  Filled 2012-11-18: qty 1

## 2012-11-18 NOTE — ED Notes (Signed)
MD at bedside. 

## 2012-11-18 NOTE — ED Provider Notes (Signed)
History     CSN: [id number]  Arrival date & time 11/18/12  2007   First MD Initiated Contact with Patient 11/18/12 2301      Chief Complaint  Patient presents with  . Recurrent Skin Infections    (Consider location/radiation/quality/duration/timing/severity/associated sxs/prior treatment) Patient is a 31 y.o. female presenting with abscess. The history is provided by the patient.  Abscess  This is a recurrent problem. The current episode started more than one week ago. The onset was gradual. The problem occurs continuously. The problem has been unchanged. Affected Location: left groin. The problem is moderate. The abscess is characterized by redness and itchiness. Associated with: none. The abscess first occurred at home. Pertinent negatives include no anorexia and no fever. Her past medical history is significant for skin abscesses in family. There were no sick contacts. Recently, medical care has been given at another facility and at this facility. Services received include medications given.    Past Medical History  Diagnosis Date  . Arthritis   . MRSA (methicillin resistant Staphylococcus aureus)   . Osteoarthritis   . Seizures     last seizure 11/16/2102    Past Surgical History  Procedure Date  . Mrsa   . Abdominal surgery   . Knee surgery     No family history on file.  History  Substance Use Topics  . Smoking status: Current Every Day Smoker -- 0.5 packs/day    Types: Cigarettes  . Smokeless tobacco: Never Used  . Alcohol Use: No    OB History    Grav Para Term Preterm Abortions TAB SAB Ect Mult Living                  Review of Systems  Constitutional: Negative for fever.  Gastrointestinal: Negative for anorexia.  Skin: Positive for color change.  All other systems reviewed and are negative.    Allergies  Penicillins and Toradol  Home Medications   Current Outpatient Rx  Name  Route  Sig  Dispense  Refill  . CLONAZEPAM 2 MG PO TABS    Oral   Take 4 mg by mouth 2 (two) times daily as needed.         Marland Kitchen DIVALPROEX SODIUM 500 MG PO TBEC   Oral   Take 600 mg by mouth 2 (two) times daily. Take 1 tab in the morning and 2 tabs at night         . DULOXETINE HCL 60 MG PO CPEP   Oral   Take 180 mg by mouth daily.          Marland Kitchen HYDROCODONE-ACETAMINOPHEN 10-325 MG PO TABS   Oral   Take 1 tablet by mouth every 6 (six) hours as needed.         . IBUPROFEN 200 MG PO TABS   Oral   Take 800 mg by mouth 2 (two) times daily as needed. Patient used this medication for her body pain.         Marland Kitchen NAPROXEN SODIUM 220 MG PO TABS   Oral   Take 220 mg by mouth 2 (two) times daily with a meal.         . QUETIAPINE FUMARATE 300 MG PO TABS   Oral   Take 600 mg by mouth at bedtime.          . ACYCLOVIR 800 MG PO TABS      Take one tablet five times per day for seven days.   35 tablet  0   . ASPIRIN PO   Oral   Take by mouth.         . OXYCODONE-ACETAMINOPHEN 5-325 MG PO TABS   Oral   Take 1-2 tablets by mouth every 4 (four) hours as needed for pain.   13 tablet   0   . OXYCODONE-ACETAMINOPHEN 5-325 MG PO TABS   Oral   Take 1 tablet by mouth every 4 (four) hours as needed for pain.   20 tablet   0   . SULFAMETHOXAZOLE-TMP DS 800-160 MG PO TABS   Oral   Take 1 tablet by mouth 2 (two) times daily.           BP 117/93  Pulse 103  Temp 97.9 F (36.6 C) (Oral)  Resp 18  Ht 5\' 1"  (1.549 m)  Wt 200 lb (90.719 kg)  BMI 37.79 kg/m2  SpO2 100%  LMP 11/14/2012  Physical Exam  Constitutional: She is oriented to person, place, and time. She appears well-developed and well-nourished. No distress.  HENT:  Head: Normocephalic and atraumatic.  Mouth/Throat: Oropharynx is clear and moist.  Eyes: Conjunctivae normal are normal. Pupils are equal, round, and reactive to light.  Neck: Normal range of motion. Neck supple.  Cardiovascular: Normal rate, regular rhythm and intact distal pulses.   Pulmonary/Chest:  Effort normal and breath sounds normal. She has no wheezes. She has no rales.  Abdominal: Soft. Bowel sounds are normal. There is no tenderness. There is no rebound and no guarding.  Genitourinary:     Musculoskeletal: Normal range of motion.  Neurological: She is alert and oriented to person, place, and time.  Skin: Skin is warm and dry.  Psychiatric: She has a normal mood and affect.    ED Course  Procedures (including critical care time)  Labs Reviewed - No data to display No results found.   No diagnosis found.    MDM  INCISION AND DRAINAGE Performed by: Jasmine Awe Consent: Verbal consent obtained. Risks and benefits: risks, benefits and alternatives were discussed Type: abscess  Body area: left outer labia  Anesthesia: local infiltration  Incision was made with a scalpel.  Local anesthetic: lidocaine 1%  Anesthetic total: 3 ml  Complexity: complex Blunt dissection to break up loculations  Drainage: purulent  Drainage amount: 4 ml  Patient tolerance: Patient tolerated the procedure well with no immediate complications.    Already on Bactrim continue same recheck in 2 days       Vy Badley K Semiyah Newgent-Rasch, MD 11/18/12 2345

## 2012-11-18 NOTE — ED Notes (Signed)
Pt reports boil on vagina x 3 days

## 2012-11-22 ENCOUNTER — Encounter (HOSPITAL_BASED_OUTPATIENT_CLINIC_OR_DEPARTMENT_OTHER): Payer: Self-pay

## 2012-11-22 ENCOUNTER — Emergency Department (HOSPITAL_BASED_OUTPATIENT_CLINIC_OR_DEPARTMENT_OTHER)
Admission: EM | Admit: 2012-11-22 | Discharge: 2012-11-22 | Disposition: A | Discharge status: Home / Self Care | Payer: Medicaid Other | Attending: Emergency Medicine | Admitting: Emergency Medicine

## 2012-11-22 DIAGNOSIS — G40909 Epilepsy, unspecified, not intractable, without status epilepticus: Secondary | ICD-10-CM | POA: Insufficient documentation

## 2012-11-22 DIAGNOSIS — M129 Arthropathy, unspecified: Secondary | ICD-10-CM | POA: Insufficient documentation

## 2012-11-22 DIAGNOSIS — L0291 Cutaneous abscess, unspecified: Secondary | ICD-10-CM

## 2012-11-22 DIAGNOSIS — Z7982 Long term (current) use of aspirin: Secondary | ICD-10-CM | POA: Insufficient documentation

## 2012-11-22 DIAGNOSIS — Z8614 Personal history of Methicillin resistant Staphylococcus aureus infection: Secondary | ICD-10-CM | POA: Insufficient documentation

## 2012-11-22 DIAGNOSIS — M1990 Unspecified osteoarthritis, unspecified site: Secondary | ICD-10-CM | POA: Insufficient documentation

## 2012-11-22 DIAGNOSIS — F17200 Nicotine dependence, unspecified, uncomplicated: Secondary | ICD-10-CM | POA: Insufficient documentation

## 2012-11-22 DIAGNOSIS — N764 Abscess of vulva: Secondary | ICD-10-CM | POA: Insufficient documentation

## 2012-11-22 DIAGNOSIS — Z79899 Other long term (current) drug therapy: Secondary | ICD-10-CM | POA: Insufficient documentation

## 2012-11-22 MED ORDER — OXYCODONE-ACETAMINOPHEN 5-325 MG PO TABS
ORAL_TABLET | ORAL | Status: AC
  Administered 2012-11-22: 1
  Filled 2012-11-22: qty 1

## 2012-11-22 MED ORDER — OXYCODONE-ACETAMINOPHEN 5-325 MG PO TABS: 2.0000 | ORAL_TABLET | ORAL | Status: AC | PRN

## 2012-11-22 MED ORDER — OXYCODONE-ACETAMINOPHEN 5-325 MG PO TABS
2.0000 | ORAL_TABLET | Freq: Once | ORAL | Status: AC
  Administered 2012-11-22: 1 via ORAL
  Filled 2012-11-22 (×2): qty 2

## 2012-11-22 NOTE — ED Notes (Signed)
Seen here for abscess 11/29 and 12/26 for abscess-states she was also seen by PCP 12/27-additional I&D in the office-pt states pain meds not controlling pain-states her PCP is closed today

## 2012-11-22 NOTE — ED Provider Notes (Signed)
History     CSN: [id number]  Arrival date & time 11/22/12  1446   First MD Initiated Contact with Patient 11/22/12 1546      Chief Complaint  Patient presents with  . Follow-up    (Consider location/radiation/quality/duration/timing/severity/associated sxs/prior treatment) Patient is a 31 y.o. female presenting with abscess. The history is provided by the patient. No language interpreter was used.  Abscess  This is a recurrent problem. The onset was gradual. The problem has been gradually worsening. The abscess is present on the genitalia. The problem is severe. The abscess is characterized by painfulness.   Pt here for recheck of abscess.  Pt reports her MD opened another area Past Medical History  Diagnosis Date  . Arthritis   . MRSA (methicillin resistant Staphylococcus aureus)   . Osteoarthritis   . Seizures     last seizure 11/16/2102    Past Surgical History  Procedure Date  . Mrsa   . Abdominal surgery   . Knee surgery     No family history on file.  History  Substance Use Topics  . Smoking status: Current Every Day Smoker -- 0.5 packs/day    Types: Cigarettes  . Smokeless tobacco: Never Used  . Alcohol Use: No    OB History    Grav Para Term Preterm Abortions TAB SAB Ect Mult Living                  Review of Systems  Skin: Positive for wound.  All other systems reviewed and are negative.    Allergies  Penicillins and Toradol  Home Medications   Current Outpatient Rx  Name  Route  Sig  Dispense  Refill  . ACYCLOVIR 800 MG PO TABS      Take one tablet five times per day for seven days.   35 tablet   0   . ASPIRIN PO   Oral   Take by mouth.         . CLONAZEPAM 2 MG PO TABS   Oral   Take 4 mg by mouth 2 (two) times daily as needed.         Marland Kitchen DIVALPROEX SODIUM 500 MG PO TBEC   Oral   Take 600 mg by mouth 2 (two) times daily. Take 1 tab in the morning and 2 tabs at night         . DULOXETINE HCL 60 MG PO CPEP   Oral  Take 180 mg by mouth daily.          Marland Kitchen HYDROCODONE-ACETAMINOPHEN 10-325 MG PO TABS   Oral   Take 1 tablet by mouth every 6 (six) hours as needed.         . IBUPROFEN 200 MG PO TABS   Oral   Take 800 mg by mouth 2 (two) times daily as needed. Patient used this medication for her body pain.         Marland Kitchen NAPROXEN SODIUM 220 MG PO TABS   Oral   Take 220 mg by mouth 2 (two) times daily with a meal.         . OXYCODONE-ACETAMINOPHEN 5-325 MG PO TABS   Oral   Take 1-2 tablets by mouth every 4 (four) hours as needed for pain.   13 tablet   0   . OXYCODONE-ACETAMINOPHEN 5-325 MG PO TABS   Oral   Take 1 tablet by mouth every 4 (four) hours as needed for pain.   20 tablet  0   . QUETIAPINE FUMARATE 300 MG PO TABS   Oral   Take 600 mg by mouth at bedtime.          . SULFAMETHOXAZOLE-TMP DS 800-160 MG PO TABS   Oral   Take 1 tablet by mouth 2 (two) times daily.           BP 132/76  Pulse 105  Temp 98.1 F (36.7 C) (Oral)  Resp 18  Ht 5\' 1"  (1.549 m)  Wt 200 lb (90.719 kg)  BMI 37.79 kg/m2  SpO2 99%  LMP 11/14/2012  Physical Exam  Nursing note and vitals reviewed. Constitutional: She appears well-developed.  HENT:  Head: Atraumatic.  Cardiovascular: Normal rate.   Pulmonary/Chest: Effort normal.  Genitourinary:       Healing incisions left labia,   I do not palpate any abscess reoccurance  Musculoskeletal: Normal range of motion.  Neurological: She is alert.    ED Course  Procedures (including critical care time)  Labs Reviewed - No data to display No results found.   1. Abscess       MDM  Pt reports vicodon not helping pain.  Pt advised to let her MD know about rx given here        Elson Areas, [place] 11/22/12 [phone]

## 2012-11-22 NOTE — ED Notes (Signed)
Pt reports I&D in perineum, taking PO antibiotics but not improving.  She reports pain medications not working.

## 2012-11-25 NOTE — ED Provider Notes (Signed)
Medical screening examination/treatment/procedure(s) were performed by non-physician practitioner and as supervising physician I was immediately available for consultation/collaboration.  Marwan T Powers, MD 11/25/12 0703 

## 2013-02-10 ENCOUNTER — Emergency Department (HOSPITAL_BASED_OUTPATIENT_CLINIC_OR_DEPARTMENT_OTHER)
Admission: EM | Admit: 2013-02-10 | Discharge: 2013-02-10 | Disposition: A | Discharge status: Home / Self Care | Payer: Medicaid Other | Attending: Emergency Medicine | Admitting: Emergency Medicine

## 2013-02-10 ENCOUNTER — Encounter (HOSPITAL_BASED_OUTPATIENT_CLINIC_OR_DEPARTMENT_OTHER): Payer: Self-pay | Admitting: *Deleted

## 2013-02-10 DIAGNOSIS — Z791 Long term (current) use of non-steroidal anti-inflammatories (NSAID): Secondary | ICD-10-CM | POA: Insufficient documentation

## 2013-02-10 DIAGNOSIS — Z7982 Long term (current) use of aspirin: Secondary | ICD-10-CM | POA: Insufficient documentation

## 2013-02-10 DIAGNOSIS — R599 Enlarged lymph nodes, unspecified: Secondary | ICD-10-CM | POA: Insufficient documentation

## 2013-02-10 DIAGNOSIS — F411 Generalized anxiety disorder: Secondary | ICD-10-CM | POA: Insufficient documentation

## 2013-02-10 DIAGNOSIS — J029 Acute pharyngitis, unspecified: Secondary | ICD-10-CM | POA: Insufficient documentation

## 2013-02-10 DIAGNOSIS — Z8614 Personal history of Methicillin resistant Staphylococcus aureus infection: Secondary | ICD-10-CM | POA: Insufficient documentation

## 2013-02-10 DIAGNOSIS — F17200 Nicotine dependence, unspecified, uncomplicated: Secondary | ICD-10-CM | POA: Insufficient documentation

## 2013-02-10 DIAGNOSIS — R509 Fever, unspecified: Secondary | ICD-10-CM | POA: Insufficient documentation

## 2013-02-10 DIAGNOSIS — R112 Nausea with vomiting, unspecified: Secondary | ICD-10-CM | POA: Insufficient documentation

## 2013-02-10 DIAGNOSIS — Z8739 Personal history of other diseases of the musculoskeletal system and connective tissue: Secondary | ICD-10-CM | POA: Insufficient documentation

## 2013-02-10 DIAGNOSIS — R197 Diarrhea, unspecified: Secondary | ICD-10-CM | POA: Insufficient documentation

## 2013-02-10 DIAGNOSIS — G40909 Epilepsy, unspecified, not intractable, without status epilepticus: Secondary | ICD-10-CM | POA: Insufficient documentation

## 2013-02-10 LAB — RAPID STREP SCREEN (MED CTR MEBANE ONLY): Streptococcus, Group A Screen (Direct): NEGATIVE

## 2013-02-10 MED ORDER — LORAZEPAM 2 MG/ML IJ SOLN
1.0000 mg | Freq: Once | INTRAMUSCULAR | Status: AC
  Administered 2013-02-10: 1 mg via INTRAMUSCULAR
  Filled 2013-02-10: qty 1

## 2013-02-10 MED ORDER — IBUPROFEN 100 MG/5ML PO SUSP
800.0000 mg | Freq: Once | ORAL | Status: AC
  Administered 2013-02-10: 800 mg via ORAL
  Filled 2013-02-10: qty 10
  Filled 2013-02-10: qty 30

## 2013-02-10 MED ORDER — LIDOCAINE VISCOUS 2 % MT SOLN
20.0000 mL | Freq: Once | OROMUCOSAL | Status: AC
  Administered 2013-02-10: 15 mL via OROMUCOSAL
  Filled 2013-02-10: qty 15

## 2013-02-10 MED ORDER — LIDOCAINE VISCOUS 2 % MT SOLN: 20.0000 mL | Freq: Four times a day (QID) | OROMUCOSAL | Status: DC | PRN

## 2013-02-10 NOTE — ED Notes (Signed)
Pt. Continues to ask for pain meds and states to RN "I got to have a perscription for pain medicine and asked that the Dr. Claudie Fisherman back to see her for some pain perscription.

## 2013-02-10 NOTE — ED Provider Notes (Signed)
History     CSN: [id number]  Arrival date & time 02/10/13  6578   First MD Initiated Contact with Patient 02/10/13 2037      Chief Complaint  Patient presents with  . Sore Throat  . Diarrhea    (Consider location/radiation/quality/duration/timing/severity/associated sxs/prior treatment) HPI  The patient presents with concerns of ongoing sore throat, fever, nausea and vomiting.  She states that symptoms began yesterday without clear precipitant.  Since onset symptoms have been persistent with no relief from OTC medication. She does not know the maximum temperature, but feels as though it improves with OTC medication. No weakness, ataxia, falls, chest pain, dyspnea. She states that she has multiple prior similar events.   Past Medical History  Diagnosis Date  . Arthritis   . MRSA (methicillin resistant Staphylococcus aureus)   . Osteoarthritis   . Seizures     last seizure 11/16/2102    Past Surgical History  Procedure Laterality Date  . Mrsa    . Abdominal surgery    . Knee surgery      No family history on file.  History  Substance Use Topics  . Smoking status: Current Every Day Smoker -- 0.50 packs/day    Types: Cigarettes  . Smokeless tobacco: Never Used  . Alcohol Use: No    OB History   Grav Para Term Preterm Abortions TAB SAB Ect Mult Living                  Review of Systems  Constitutional:       Per HPI, otherwise negative  HENT:       Per HPI, otherwise negative  Respiratory:       Per HPI, otherwise negative  Cardiovascular:       Per HPI, otherwise negative  Gastrointestinal: Negative for vomiting.  Endocrine:       Negative aside from HPI  Genitourinary:       Neg aside from HPI   Musculoskeletal:       Per HPI, otherwise negative  Skin: Negative.   Neurological: Negative for syncope.    Allergies  Penicillins and Toradol  Home Medications   Current Outpatient Rx  Name  Route  Sig  Dispense  Refill  . acyclovir (ZOVIRAX)  800 MG tablet      Take one tablet five times per day for seven days.   35 tablet   0   . ASPIRIN PO   Oral   Take by mouth.         . clonazePAM (KLONOPIN) 2 MG tablet   Oral   Take 4 mg by mouth 2 (two) times daily as needed.         . divalproex (DEPAKOTE) 500 MG EC tablet   Oral   Take 600 mg by mouth 2 (two) times daily. Take 1 tab in the morning and 2 tabs at night         . DULoxetine (CYMBALTA) 60 MG capsule   Oral   Take 180 mg by mouth daily.          Marland Kitchen HYDROcodone-acetaminophen (NORCO) 10-325 MG per tablet   Oral   Take 1 tablet by mouth every 6 (six) hours as needed.         Marland Kitchen ibuprofen (ADVIL,MOTRIN) 200 MG tablet   Oral   Take 800 mg by mouth 2 (two) times daily as needed. Patient used this medication for her body pain.         Marland Kitchen  naproxen sodium (ANAPROX) 220 MG tablet   Oral   Take 220 mg by mouth 2 (two) times daily with a meal.         . oxyCODONE-acetaminophen (PERCOCET/ROXICET) 5-325 MG per tablet   Oral   Take 1-2 tablets by mouth every 4 (four) hours as needed for pain.   13 tablet   0   . oxyCODONE-acetaminophen (ROXICET) 5-325 MG per tablet   Oral   Take 1 tablet by mouth every 4 (four) hours as needed for pain.   20 tablet   0   . QUEtiapine (SEROQUEL) 300 MG tablet   Oral   Take 600 mg by mouth at bedtime.          . sulfamethoxazole-trimethoprim (BACTRIM DS) 800-160 MG per tablet   Oral   Take 1 tablet by mouth 2 (two) times daily.           BP 117/66  Pulse 103  Temp(Src) 98.1 F (36.7 C) (Oral)  Resp 20  Ht 5\' 1"  (1.549 m)  Wt 189 lb (85.73 kg)  BMI 35.73 kg/m2  SpO2 99%  LMP 02/03/2013  Physical Exam  Nursing note and vitals reviewed. Constitutional: She is oriented to person, place, and time. She appears well-developed and well-nourished.  Uncomfortable appearing young female  HENT:  Head: Normocephalic and atraumatic.  Mouth/Throat: Uvula is midline and mucous membranes are normal. Posterior  oropharyngeal erythema present. No oropharyngeal exudate, posterior oropharyngeal edema or tonsillar abscesses.  Eyes: Conjunctivae and EOM are normal.  Cardiovascular: Normal rate and regular rhythm.   Pulmonary/Chest: Effort normal and breath sounds normal. No stridor. No respiratory distress.  Abdominal: She exhibits no distension.  Musculoskeletal: She exhibits no edema.  Lymphadenopathy:    She has cervical adenopathy.       Right cervical: Superficial cervical adenopathy present.       Left cervical: Superficial cervical adenopathy present.  Neurological: She is alert and oriented to person, place, and time. No cranial nerve deficit.  Skin: Skin is warm and dry.  Psychiatric: Her mood appears anxious.    ED Course  Procedures (including critical care time)  Labs Reviewed  RAPID STREP SCREEN   No results found.   No diagnosis found.  A review of the prescription drug database demonstrates the patient obtained 120 pills of Percocet 8 days ago.  MDM  The patient presents with sore throat, nausea, vomiting.  On initial exam the patient is uncomfortable appearing, though she appears substantially better after Ativan.  The patient's strep test is negative him or her vital signs are largely unremarkable, and after a period of observation she requested discharge, due to work responsibilities.  Absent distress, evidence of systemic compromise or infection, this was accommodated.    Gerhard Munch, MD 02/10/13 2215

## 2013-02-10 NOTE — ED Notes (Signed)
Pt states she her body has been in pain since Tuesday as well as having diarrhea. Pt states she had a seizure 2 days ago and has not been able to get in to see her neurologist.

## 2013-02-10 NOTE — ED Notes (Signed)
Pt sts she has had sore throat, fever and diarrhea since yesterday.

## 2013-02-10 NOTE — Discharge Instructions (Signed)
As discussed, it is important that you follow up as soon as possible with your physician for continued management of your condition. ° °If you develop any new, or concerning changes in your condition, please return to the emergency department immediately. ° °

## 2013-02-25 ENCOUNTER — Emergency Department (HOSPITAL_BASED_OUTPATIENT_CLINIC_OR_DEPARTMENT_OTHER): Payer: Medicaid Other

## 2013-02-25 ENCOUNTER — Encounter (HOSPITAL_BASED_OUTPATIENT_CLINIC_OR_DEPARTMENT_OTHER): Payer: Self-pay | Admitting: Emergency Medicine

## 2013-02-25 ENCOUNTER — Emergency Department (HOSPITAL_BASED_OUTPATIENT_CLINIC_OR_DEPARTMENT_OTHER)
Admission: EM | Admit: 2013-02-25 | Discharge: 2013-02-25 | Disposition: A | Discharge status: Home / Self Care | Payer: Medicaid Other | Attending: Emergency Medicine | Admitting: Emergency Medicine

## 2013-02-25 DIAGNOSIS — Z3202 Encounter for pregnancy test, result negative: Secondary | ICD-10-CM | POA: Insufficient documentation

## 2013-02-25 DIAGNOSIS — W1809XA Striking against other object with subsequent fall, initial encounter: Secondary | ICD-10-CM | POA: Insufficient documentation

## 2013-02-25 DIAGNOSIS — Z9889 Other specified postprocedural states: Secondary | ICD-10-CM | POA: Insufficient documentation

## 2013-02-25 DIAGNOSIS — S99929A Unspecified injury of unspecified foot, initial encounter: Secondary | ICD-10-CM | POA: Insufficient documentation

## 2013-02-25 DIAGNOSIS — Y9389 Activity, other specified: Secondary | ICD-10-CM | POA: Insufficient documentation

## 2013-02-25 DIAGNOSIS — S8990XA Unspecified injury of unspecified lower leg, initial encounter: Secondary | ICD-10-CM | POA: Insufficient documentation

## 2013-02-25 DIAGNOSIS — Z8614 Personal history of Methicillin resistant Staphylococcus aureus infection: Secondary | ICD-10-CM | POA: Insufficient documentation

## 2013-02-25 DIAGNOSIS — G8929 Other chronic pain: Secondary | ICD-10-CM | POA: Insufficient documentation

## 2013-02-25 DIAGNOSIS — S301XXA Contusion of abdominal wall, initial encounter: Secondary | ICD-10-CM

## 2013-02-25 DIAGNOSIS — M25562 Pain in left knee: Secondary | ICD-10-CM

## 2013-02-25 DIAGNOSIS — S93409A Sprain of unspecified ligament of unspecified ankle, initial encounter: Secondary | ICD-10-CM | POA: Insufficient documentation

## 2013-02-25 DIAGNOSIS — F17200 Nicotine dependence, unspecified, uncomplicated: Secondary | ICD-10-CM | POA: Insufficient documentation

## 2013-02-25 DIAGNOSIS — M25561 Pain in right knee: Secondary | ICD-10-CM

## 2013-02-25 DIAGNOSIS — Z7982 Long term (current) use of aspirin: Secondary | ICD-10-CM | POA: Insufficient documentation

## 2013-02-25 DIAGNOSIS — S93401A Sprain of unspecified ligament of right ankle, initial encounter: Secondary | ICD-10-CM

## 2013-02-25 DIAGNOSIS — S5002XA Contusion of left elbow, initial encounter: Secondary | ICD-10-CM

## 2013-02-25 DIAGNOSIS — W108XXA Fall (on) (from) other stairs and steps, initial encounter: Secondary | ICD-10-CM | POA: Insufficient documentation

## 2013-02-25 DIAGNOSIS — S5000XA Contusion of unspecified elbow, initial encounter: Secondary | ICD-10-CM | POA: Insufficient documentation

## 2013-02-25 DIAGNOSIS — Z79899 Other long term (current) drug therapy: Secondary | ICD-10-CM | POA: Insufficient documentation

## 2013-02-25 DIAGNOSIS — Z8739 Personal history of other diseases of the musculoskeletal system and connective tissue: Secondary | ICD-10-CM | POA: Insufficient documentation

## 2013-02-25 DIAGNOSIS — G40909 Epilepsy, unspecified, not intractable, without status epilepticus: Secondary | ICD-10-CM | POA: Insufficient documentation

## 2013-02-25 DIAGNOSIS — M1990 Unspecified osteoarthritis, unspecified site: Secondary | ICD-10-CM | POA: Insufficient documentation

## 2013-02-25 DIAGNOSIS — W19XXXA Unspecified fall, initial encounter: Secondary | ICD-10-CM

## 2013-02-25 DIAGNOSIS — Y9289 Other specified places as the place of occurrence of the external cause: Secondary | ICD-10-CM | POA: Insufficient documentation

## 2013-02-25 LAB — COMPREHENSIVE METABOLIC PANEL
ALT: 41 U/L — ABNORMAL HIGH (ref 0–35)
Alkaline Phosphatase: 80 U/L (ref 39–117)
GFR calc Af Amer: >90 mL/min (ref >90)
Glucose, Bld: 118 mg/dL — ABNORMAL HIGH (ref 70–99)
Potassium: 3.7 mEq/L (ref 3.5–5.1)
Sodium: 139 mEq/L (ref 135–145)
Total Protein: 7.9 g/dL (ref 6.0–8.3)

## 2013-02-25 LAB — CBC WITH DIFFERENTIAL/PLATELET
Eosinophils Absolute: 0.2 10*3/uL (ref 0.0–0.7)
Lymphocytes Relative: 37 % (ref 12–46)
Lymphs Abs: 2.8 10*3/uL (ref 0.7–4.0)
Neutro Abs: 4 10*3/uL (ref 1.7–7.7)
Neutrophils Relative %: 53 % (ref 43–77)
Platelets: 257 10*3/uL (ref 150–400)
RBC: 3.83 MIL/uL — ABNORMAL LOW (ref 3.87–5.11)
WBC: 7.6 10*3/uL (ref 4.0–10.5)

## 2013-02-25 LAB — URINE MICROSCOPIC-ADD ON

## 2013-02-25 LAB — URINALYSIS, ROUTINE W REFLEX MICROSCOPIC
Bilirubin Urine: NEGATIVE
Specific Gravity, Urine: 1.018 (ref 1.005–1.030)
Urobilinogen, UA: 1 mg/dL (ref 0.0–1.0)
pH: 7 (ref 5.0–8.0)

## 2013-02-25 MED ORDER — ONDANSETRON HCL 4 MG/2ML IJ SOLN
4.0000 mg | Freq: Once | INTRAMUSCULAR | Status: AC
  Administered 2013-02-25: 4 mg via INTRAVENOUS
  Filled 2013-02-25: qty 2

## 2013-02-25 MED ORDER — MORPHINE SULFATE 4 MG/ML IJ SOLN
4.0000 mg | Freq: Once | INTRAMUSCULAR | Status: AC
  Administered 2013-02-25: 4 mg via INTRAVENOUS
  Filled 2013-02-25: qty 1

## 2013-02-25 MED ORDER — IOHEXOL 300 MG/ML  SOLN
100.0000 mL | Freq: Once | INTRAMUSCULAR | Status: AC | PRN
  Administered 2013-02-25: 100 mL via INTRAVENOUS

## 2013-02-25 NOTE — ED Provider Notes (Signed)
Medical screening examination/treatment/procedure(s) were performed by non-physician practitioner and as supervising physician I was immediately available for consultation/collaboration.   Kambry Takacs B. Adom Schoeneck, MD 02/25/13 2144 

## 2013-02-25 NOTE — ED Provider Notes (Signed)
History     CSN: [id number]  Arrival date & time 02/25/13  1805   First MD Initiated Contact with Patient 02/25/13 1832      Chief Complaint  Patient presents with  . Fall    (Consider location/radiation/quality/duration/timing/severity/associated sxs/prior treatment) HPI Comments: Pt states that she fell off the deck from about 6 steps up and hit a grill and then the pavement:pt states there was no loc:pt states that she tried to get relief at home but hasn't gotten any:pt c/o pain and bruising to the upper abdomen:pain in bilateral knee, left elbow pain and right ankle pain:pt states that she has chronic knee pain but this this is different  Patient is a 32 y.o. female presenting with fall. The history is provided by the patient. No language interpreter was used.  Fall The accident occurred 2 days ago. Fall occurred: from a deck. She landed on concrete. There was no blood loss. The pain is severe. She was ambulatory at the scene. There was no entrapment after the fall. There was no drug use involved in the accident. There was no alcohol use involved in the accident. Associated symptoms include abdominal pain. Pertinent negatives include no fever, no numbness, no loss of consciousness and no tingling. The symptoms are aggravated by activity.    Past Medical History  Diagnosis Date  . Arthritis   . MRSA (methicillin resistant Staphylococcus aureus)   . Osteoarthritis   . Seizures     last seizure 11/16/2102    Past Surgical History  Procedure Laterality Date  . Mrsa    . Abdominal surgery    . Knee surgery      No family history on file.  History  Substance Use Topics  . Smoking status: Current Every Day Smoker -- 0.50 packs/day    Types: Cigarettes  . Smokeless tobacco: Never Used  . Alcohol Use: No    OB History   Grav Para Term Preterm Abortions TAB SAB Ect Mult Living                  Review of Systems  Constitutional: Negative for fever.  Respiratory:  Negative.   Cardiovascular: Negative.   Gastrointestinal: Positive for abdominal pain.  Neurological: Negative for tingling, loss of consciousness and numbness.    Allergies  Tramadol; Penicillins; and Toradol  Home Medications   Current Outpatient Rx  Name  Route  Sig  Dispense  Refill  . acyclovir (ZOVIRAX) 800 MG tablet      Take one tablet five times per day for seven days.   35 tablet   0   . ASPIRIN PO   Oral   Take by mouth.         . clonazePAM (KLONOPIN) 2 MG tablet   Oral   Take 4 mg by mouth 2 (two) times daily as needed.         . divalproex (DEPAKOTE) 500 MG EC tablet   Oral   Take 600 mg by mouth 2 (two) times daily. Take 1 tab in the morning and 2 tabs at night         . DULoxetine (CYMBALTA) 60 MG capsule   Oral   Take 180 mg by mouth daily.          Marland Kitchen ibuprofen (ADVIL,MOTRIN) 200 MG tablet   Oral   Take 800 mg by mouth 2 (two) times daily as needed. Patient used this medication for her body pain.         Marland Kitchen  lidocaine (XYLOCAINE) 2 % solution   Oral   Take 20 mLs by mouth every 6 (six) hours as needed for pain.   100 mL   0   . naproxen sodium (ANAPROX) 220 MG tablet   Oral   Take 220 mg by mouth 2 (two) times daily with a meal.         . QUEtiapine (SEROQUEL) 300 MG tablet   Oral   Take 600 mg by mouth at bedtime.          . sulfamethoxazole-trimethoprim (BACTRIM DS) 800-160 MG per tablet   Oral   Take 1 tablet by mouth 2 (two) times daily.           BP 117/91  Pulse 95  Temp(Src) 98.3 F (36.8 C) (Oral)  Resp 18  Ht 5\' 1"  (1.549 m)  Wt 195 lb (88.451 kg)  BMI 36.86 kg/m2  SpO2 99%  LMP 01/20/2013  Physical Exam  Nursing note and vitals reviewed. Constitutional: She is oriented to person, place, and time. She appears well-developed and well-nourished.  HENT:  Head: Normocephalic and atraumatic.  Eyes: Conjunctivae and EOM are normal.  Neck: Normal range of motion. Neck supple.  Cardiovascular: Normal rate and  regular rhythm.   Pulmonary/Chest: Effort normal and breath sounds normal.  Abdominal: Soft. Bowel sounds are normal.  Pt has tenderness noted across the upper abdomen, with bruising noted to the area  Musculoskeletal: Normal range of motion.       Cervical back: She exhibits bony tenderness.       Thoracic back: Normal.       Lumbar back: Normal.  Pt has swelling noted to the right lateral ankle:pt has full OZH:[id number] noted to bilateral knees:pt has swelling and tenderness noted to the posterior left elbow  Neurological: She is alert and oriented to person, place, and time.  Skin:  Abrasion noted to knees bilaterally and left elbow:brusing noted to upper abdomen and left elbow    ED Course  Procedures (including critical care time)  Labs Reviewed  CBC WITH DIFFERENTIAL - Abnormal; Notable for the following:    RBC 3.83 (*)    HCT 35.2 (*)    All other components within normal limits  COMPREHENSIVE METABOLIC PANEL - Abnormal; Notable for the following:    Glucose, Bld 118 (*)    Albumin 3.3 (*)    ALT 41 (*)    All other components within normal limits  URINALYSIS, ROUTINE W REFLEX MICROSCOPIC - Abnormal; Notable for the following:    APPearance CLOUDY (*)    Hgb urine dipstick SMALL (*)    Leukocytes, UA MODERATE (*)    All other components within normal limits  URINE MICROSCOPIC-ADD ON - Abnormal; Notable for the following:    Squamous Epithelial / LPF FEW (*)    Bacteria, UA FEW (*)    All other components within normal limits  URINE CULTURE  LIPASE, BLOOD  PREGNANCY, URINE   Dg Cervical Spine Complete  02/25/2013  *RADIOLOGY REPORT*  Clinical Data: Larey Seat from steps 2 days ago.  Left-sided neck pain.  CERVICAL SPINE - COMPLETE 4+ VIEW  Comparison: None.  Findings: There is normal alignment of the cervical spine.  There is no evidence for acute fracture or dislocation.  Prevertebral soft tissues have a normal appearance.  Lung apices have a normal appearance.  IMPRESSION:  Negative exam.   Original Report Authenticated By: Norva Pavlov, M.D.    Dg Elbow Complete Left  02/25/2013  *RADIOLOGY  REPORT*  Clinical Data: Larey Seat from steps 2 days ago.  Elbow abrasions.  LEFT ELBOW - COMPLETE 3+ VIEW  Comparison: None.  Findings: There is no evidence for acute fracture or subluxation. No joint effusion.  No radiopaque foreign bodies or soft tissue gas.  IMPRESSION: Negative exam.   Original Report Authenticated By: Norva Pavlov, M.D.    Dg Ankle Complete Right  02/25/2013  *RADIOLOGY REPORT*  Clinical Data: Larey Seat from steps 2 days ago.  Ankle swelling.  RIGHT ANKLE - COMPLETE 3+ VIEW  Comparison: None.  Findings: There is significant irregularity of the tibia talar joint, consistent with prior injury and associated degenerative changes.  There is lucency along the talar dome at its medial articular surface.  The findings raise the question of osteochondral defect but could be related to degenerative changes which are described above.  There is diffuse soft tissue swelling. No other evidence for acute fracture.  IMPRESSION:  1.  Diffuse soft tissue swelling. 2.  Significant degenerative changes. 3.  Possible osteochondral defect versus superimposed degenerative changes along the medial aspect of the talar dome.  See above.   Original Report Authenticated By: Norva Pavlov, M.D.    Ct Abdomen Pelvis W Contrast  02/25/2013  *RADIOLOGY REPORT*  Clinical Data: Fall.  CT ABDOMEN AND PELVIS WITH CONTRAST  Technique:  Multidetector CT imaging of the abdomen and pelvis was performed following the standard protocol during bolus administration of intravenous contrast.  Contrast: OMNIPAQUE IOHEXOL 300 MG/ML  SOLN  Comparison: 07/29/2011  Findings:  There is no pleural effusion.  The lung bases are clear.  No focal liver abnormality.  The gallbladder is normal.  No biliary dilatation.  Normal appearance of the pancreas.  The spleen is normal and intact.  Normal appearance of the adrenal  glands.  Cyst within the upper pole of the right kidney measures 1 cm.  The cyst within the upper pole the left kidney measures 1.2 cm.  The urinary bladder appears normal.  The uterus and the adnexal structures are unremarkable.  The stomach and the small bowel loops appear within normal limits. The appendix appears normal.  Normal appearance of the colon.  There is no free fluid or abnormal fluid collection identified within the abdomen or pelvis.  Review of the visualized bony structures shows no displaced rib fractures.  IMPRESSION:  1.  No acute findings. 2.  There is no mass or adenopathy noted. 3.  Bilateral renal cysts   Original Report Authenticated By: Signa Kell, M.D.    Dg Knee Complete 4 Views Left  02/25/2013  *RADIOLOGY REPORT*  Clinical Data: Larey Seat from steps 2 days ago.  Bilateral anterior knee abrasions.  The  LEFT KNEE - COMPLETE 4+ VIEW  Comparison: None.  Findings: There is a small joint effusion.  There is moderate degenerative change involving the medial, lateral, and patellofemoral compartments. No evidence for acute fracture or subluxation.  IMPRESSION:  1.  Degenerative changes. 2.  Joint effusion. 3.  If pain persists, consider follow-up MRI.   Original Report Authenticated By: Norva Pavlov, M.D.    Dg Knee Complete 4 Views Right  02/25/2013  *RADIOLOGY REPORT*  Clinical Data: Fall.  Bilateral anterior knee abrasions.  Right ankle swelling.  RIGHT KNEE - COMPLETE 4+ VIEW  Comparison: None.  Findings: A small joint effusion is present.  There is medial, lateral, and patellofemoral compartment narrowing, consistent with degenerative changes.  No evidence for acute fracture or subluxation.  IMPRESSION:  1.  Degenerative changes.  2.  Small joint effusion. 3.  If pain persists, consider further evaluation with MRI.   Original Report Authenticated By: Norva Pavlov, M.D.      1. Abdominal contusion, initial encounter   2. Fall, initial encounter   3. Ankle sprain, right, initial  encounter   4. Knee pain, left   5. Knee pain, right   6. Elbow contusion, left, initial encounter       MDM  Pt states that she can't have pain medication to go home because he has a pain contract and is on percocet:pt is okay to follow up as needed:no acute injury noted on imaging:pt is neurologically intact       Teressa Lower, NP 02/25/13 2019

## 2013-02-25 NOTE — ED Notes (Signed)
Fell from 6th step on deck on 02/23/13..   States the grill on her deck fell onto her upper abdomen.  Has taken Aleve and IBU without relief.  Nausea, no vomiting today.

## 2013-02-25 NOTE — ED Notes (Signed)
Patient transported to X-ray 

## 2013-02-26 LAB — URINE CULTURE

## 2013-03-20 ENCOUNTER — Emergency Department (HOSPITAL_BASED_OUTPATIENT_CLINIC_OR_DEPARTMENT_OTHER): Payer: Medicaid Other

## 2013-03-20 ENCOUNTER — Emergency Department (HOSPITAL_BASED_OUTPATIENT_CLINIC_OR_DEPARTMENT_OTHER)
Admission: EM | Admit: 2013-03-20 | Discharge: 2013-03-20 | Disposition: A | Discharge status: Home / Self Care | Payer: Medicaid Other | Attending: Emergency Medicine | Admitting: Emergency Medicine

## 2013-03-20 ENCOUNTER — Encounter (HOSPITAL_BASED_OUTPATIENT_CLINIC_OR_DEPARTMENT_OTHER): Payer: Self-pay | Admitting: *Deleted

## 2013-03-20 DIAGNOSIS — Z880 Allergy status to penicillin: Secondary | ICD-10-CM | POA: Insufficient documentation

## 2013-03-20 DIAGNOSIS — M1990 Unspecified osteoarthritis, unspecified site: Secondary | ICD-10-CM | POA: Insufficient documentation

## 2013-03-20 DIAGNOSIS — S8990XA Unspecified injury of unspecified lower leg, initial encounter: Secondary | ICD-10-CM | POA: Insufficient documentation

## 2013-03-20 DIAGNOSIS — Z791 Long term (current) use of non-steroidal anti-inflammatories (NSAID): Secondary | ICD-10-CM | POA: Insufficient documentation

## 2013-03-20 DIAGNOSIS — F17200 Nicotine dependence, unspecified, uncomplicated: Secondary | ICD-10-CM | POA: Insufficient documentation

## 2013-03-20 DIAGNOSIS — Y9389 Activity, other specified: Secondary | ICD-10-CM | POA: Insufficient documentation

## 2013-03-20 DIAGNOSIS — Z3202 Encounter for pregnancy test, result negative: Secondary | ICD-10-CM | POA: Insufficient documentation

## 2013-03-20 DIAGNOSIS — Z7982 Long term (current) use of aspirin: Secondary | ICD-10-CM | POA: Insufficient documentation

## 2013-03-20 DIAGNOSIS — Z8614 Personal history of Methicillin resistant Staphylococcus aureus infection: Secondary | ICD-10-CM | POA: Insufficient documentation

## 2013-03-20 DIAGNOSIS — Z79899 Other long term (current) drug therapy: Secondary | ICD-10-CM | POA: Insufficient documentation

## 2013-03-20 DIAGNOSIS — M129 Arthropathy, unspecified: Secondary | ICD-10-CM | POA: Insufficient documentation

## 2013-03-20 DIAGNOSIS — Z96659 Presence of unspecified artificial knee joint: Secondary | ICD-10-CM | POA: Insufficient documentation

## 2013-03-20 DIAGNOSIS — Z9889 Other specified postprocedural states: Secondary | ICD-10-CM | POA: Insufficient documentation

## 2013-03-20 DIAGNOSIS — W108XXA Fall (on) (from) other stairs and steps, initial encounter: Secondary | ICD-10-CM | POA: Insufficient documentation

## 2013-03-20 DIAGNOSIS — R112 Nausea with vomiting, unspecified: Secondary | ICD-10-CM | POA: Insufficient documentation

## 2013-03-20 DIAGNOSIS — Y9289 Other specified places as the place of occurrence of the external cause: Secondary | ICD-10-CM | POA: Insufficient documentation

## 2013-03-20 DIAGNOSIS — G8929 Other chronic pain: Secondary | ICD-10-CM

## 2013-03-20 DIAGNOSIS — G40909 Epilepsy, unspecified, not intractable, without status epilepticus: Secondary | ICD-10-CM | POA: Insufficient documentation

## 2013-03-20 NOTE — ED Notes (Signed)
Pt states she fell yesterday going to the mailbox and injured both her knees. Scheduled for surgery May 12th on both knees.

## 2013-03-20 NOTE — ED Notes (Signed)
Patient transported to X-ray 

## 2013-03-20 NOTE — ED Provider Notes (Signed)
History    This chart was scribed for Hilario Quarry, MD by Donne Anon, ED Scribe. This patient was seen in room [facility] and the patient's care was started at 1517.   CSN: [id number]  Arrival date & time 03/20/13  1336   First MD Initiated Contact with Patient 03/20/13 1517      Chief Complaint  Patient presents with  . Knee Injury     The history is provided by the patient. No language interpreter was used.   Stacy Pratt is a 32 y.o. female who presents to the Emergency Department complaining of sudden onset, constant, gradually worsening bilateral knee pain which began yesterday when she fell down 6 stairs while getting her mail. She state she has a h/o osteoarthritis and is scheduled to have a double knee replacement on May 12. She tried ibuprofen and and an OTC pill for osteoarthritis today with little relief. Dr. Montez Morita at [facility] is her PCP. Osteoarthritis She also states she has been having nausea and vomiting for 2 days. She reports she cannot tolerate food or liquid without throwing up +nausea and vomitting Past Medical History  Diagnosis Date  . Arthritis   . MRSA (methicillin resistant Staphylococcus aureus)   . Osteoarthritis   . Seizures     last seizure 11/16/2102    Past Surgical History  Procedure Laterality Date  . Mrsa    . Abdominal surgery    . Knee surgery      History reviewed. No pertinent family history.  History  Substance Use Topics  . Smoking status: Current Every Day Smoker -- 0.50 packs/day    Types: Cigarettes  . Smokeless tobacco: Never Used  . Alcohol Use: No     Review of Systems  Gastrointestinal: Positive for nausea and vomiting.  Musculoskeletal: Positive for arthralgias.  All other systems reviewed and are negative.    Allergies  Tramadol; Penicillins; and Toradol  Home Medications   Current Outpatient Rx  Name  Route  Sig  Dispense  Refill  . acyclovir (ZOVIRAX) 800 MG tablet      Take one tablet five  times per day for seven days.   35 tablet   0   . ASPIRIN PO   Oral   Take by mouth.         . clonazePAM (KLONOPIN) 2 MG tablet   Oral   Take 4 mg by mouth 2 (two) times daily as needed.         . divalproex (DEPAKOTE) 500 MG EC tablet   Oral   Take 600 mg by mouth 2 (two) times daily. Take 1 tab in the morning and 2 tabs at night         . DULoxetine (CYMBALTA) 60 MG capsule   Oral   Take 180 mg by mouth daily.          Marland Kitchen HYDROcodone-acetaminophen (NORCO) 10-325 MG per tablet   Oral   Take 1 tablet by mouth every 6 (six) hours.         Marland Kitchen ibuprofen (ADVIL,MOTRIN) 200 MG tablet   Oral   Take 800 mg by mouth 2 (two) times daily as needed. Patient used this medication for her body pain.         Marland Kitchen lidocaine (XYLOCAINE) 2 % solution   Oral   Take 20 mLs by mouth every 6 (six) hours as needed for pain.   100 mL   0   . naproxen sodium (ANAPROX) 220 MG  tablet   Oral   Take 220 mg by mouth 2 (two) times daily with a meal.         . QUEtiapine (SEROQUEL) 300 MG tablet   Oral   Take 600 mg by mouth at bedtime.          . sulfamethoxazole-trimethoprim (BACTRIM DS) 800-160 MG per tablet   Oral   Take 1 tablet by mouth 2 (two) times daily.           Triage Vitals; BP 128/81  Pulse 78  Temp(Src) 97.7 F (36.5 C) (Oral)  Resp 20  Ht 5\' 1"  (1.549 m)  Wt 200 lb (90.719 kg)  BMI 37.81 kg/m2  SpO2 96%  LMP 01/13/2013  Physical Exam  Nursing note and vitals reviewed. Constitutional: She is oriented to person, place, and time. She appears well-developed and well-nourished. No distress.  HENT:  Head: Normocephalic and atraumatic.  Eyes: EOM are normal.  Neck: Neck supple. No tracheal deviation present.  Cardiovascular: Normal rate.   Pulmonary/Chest: Effort normal. No respiratory distress.  Musculoskeletal: Normal range of motion.  Neurological: She is alert and oriented to person, place, and time.  Skin: Skin is warm and dry.  Psychiatric: She has  a normal mood and affect. Her behavior is normal.    ED Course  Procedures (including critical care time) DIAGNOSTIC STUDIES: Oxygen Saturation is 96% on room air, adequate by my interpretation.    COORDINATION OF CARE: 3:19 PM Discussed treatment plan which includes knee xray and ibuprofen with pt at bedside and pt agreed to plan.     Labs Reviewed  PREGNANCY, URINE   Dg Knee Complete 4 Views Left  03/20/2013  *RADIOLOGY REPORT*  Clinical Data: Fall.  Knee injury with anterior pain.  LEFT KNEE - COMPLETE 4+ VIEW  Comparison: 02/25/2013  Findings: Prominent medial and lateral compartmental loss of articular space observed with marginal spurring compatible with severe osteoarthritis.  Persistent knee effusion in the suprapatellar bursa with mild stranding in Hoffa's fat pad.  No fracture observed.  IMPRESSION:  1.  Stable appearance of prominent osteoarthritis and underlying knee effusion.  No fracture or acute bony findings observed.   Original Report Authenticated By: Gaylyn Rong, M.D.    Dg Knee Complete 4 Views Right  03/20/2013  *RADIOLOGY REPORT*  Clinical Data: Fall with anterior knee injury and pain.  RIGHT KNEE - COMPLETE 4+ VIEW  Comparison: 02/25/2013  Findings: Moderate to severe loss of articular space in the medial compartment noted with moderate loss articular space in the lateral compartment.  Persistent knee effusion as on prior exam from earlier this month.  Mild patellar spurring.  No fracture observed.  IMPRESSION:  1.  Moderate to prominent osteoarthritis with persistent knee effusion.  Stable from 02/25/2013.   Original Report Authenticated By: Gaylyn Rong, M.D.      No diagnosis found.    MDM  I personally performed the services described in this documentation, which was scribed in my presence. The recorded information has been reviewed and considered.         Hilario Quarry, MD 03/20/13 [phone]

## 2013-03-20 NOTE — ED Notes (Signed)
Pt has been yelling in the hallway multiple times to get staff and visitor's attention.  Pt states that "she cannot help it" because she is in severe pain, and that she has been sitting there for over an hour with no one checking on her.  This is untrue as this RN has personally attended to her questions and needs every 5-10 minutes since she was roomed in Hallway Bed 2.  The patient has requested to be placed in a room multiple times when this is not possible due to volume and acuity of patients in the department.  Ms. Cannell was asked to exercise her patience and refrain from yelling in the hallway or she would be returned to the waiting room to wait for a room assignment there.  It was explained that her care could be expedited if she would cooperate with being placed in a hallway bed; however, this will again require that she exercise patience and refrain from yelling out unnecessarily.

## 2013-04-30 DIAGNOSIS — Z22321 Carrier or suspected carrier of Methicillin susceptible Staphylococcus aureus: Secondary | ICD-10-CM | POA: Insufficient documentation

## 2013-12-23 DIAGNOSIS — G8929 Other chronic pain: Secondary | ICD-10-CM | POA: Insufficient documentation

## 2013-12-23 DIAGNOSIS — F329 Major depressive disorder, single episode, unspecified: Secondary | ICD-10-CM | POA: Insufficient documentation

## 2013-12-23 DIAGNOSIS — F32A Depression, unspecified: Secondary | ICD-10-CM | POA: Insufficient documentation

## 2014-03-24 DIAGNOSIS — Z5181 Encounter for therapeutic drug level monitoring: Secondary | ICD-10-CM | POA: Insufficient documentation

## 2014-03-24 DIAGNOSIS — B999 Unspecified infectious disease: Secondary | ICD-10-CM | POA: Insufficient documentation

## 2014-03-24 DIAGNOSIS — M1990 Unspecified osteoarthritis, unspecified site: Secondary | ICD-10-CM | POA: Insufficient documentation

## 2014-03-24 DIAGNOSIS — A4901 Methicillin susceptible Staphylococcus aureus infection, unspecified site: Secondary | ICD-10-CM | POA: Insufficient documentation

## 2014-04-28 IMAGING — CR DG ANKLE COMPLETE 3+V*R*
3 series · 3 of 3 positions shown · non-contrast
Comparison: None.

CLINICAL DATA: Fell from steps 2 days ago.  Ankle swelling.

RIGHT ANKLE - COMPLETE 3+ VIEW

[t ankle joint ap right]
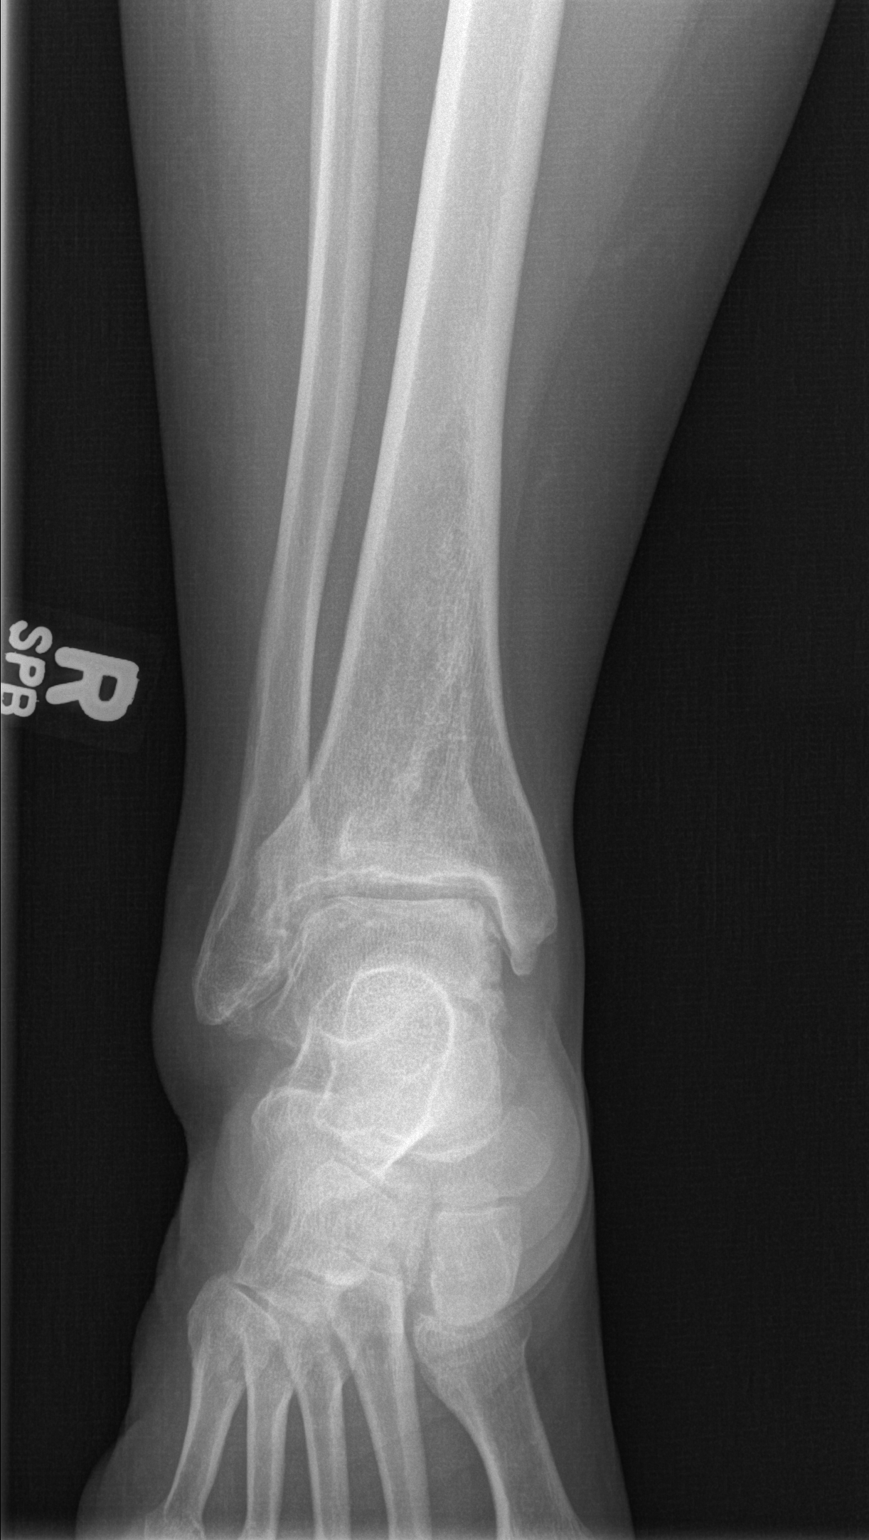

[t ankle joint oblique right]
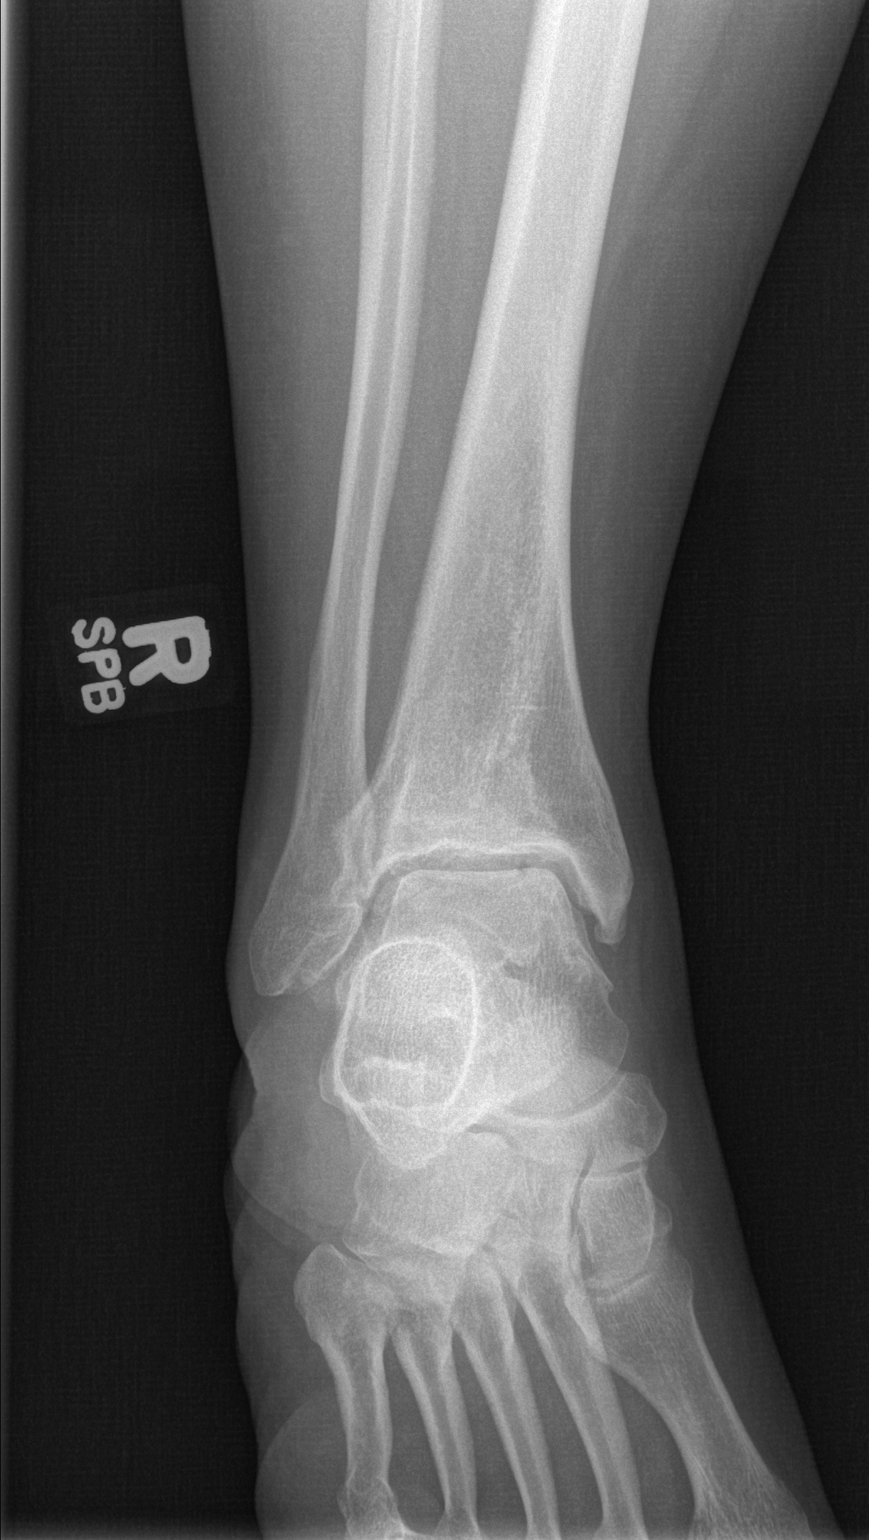

[t ankle joint lat right]
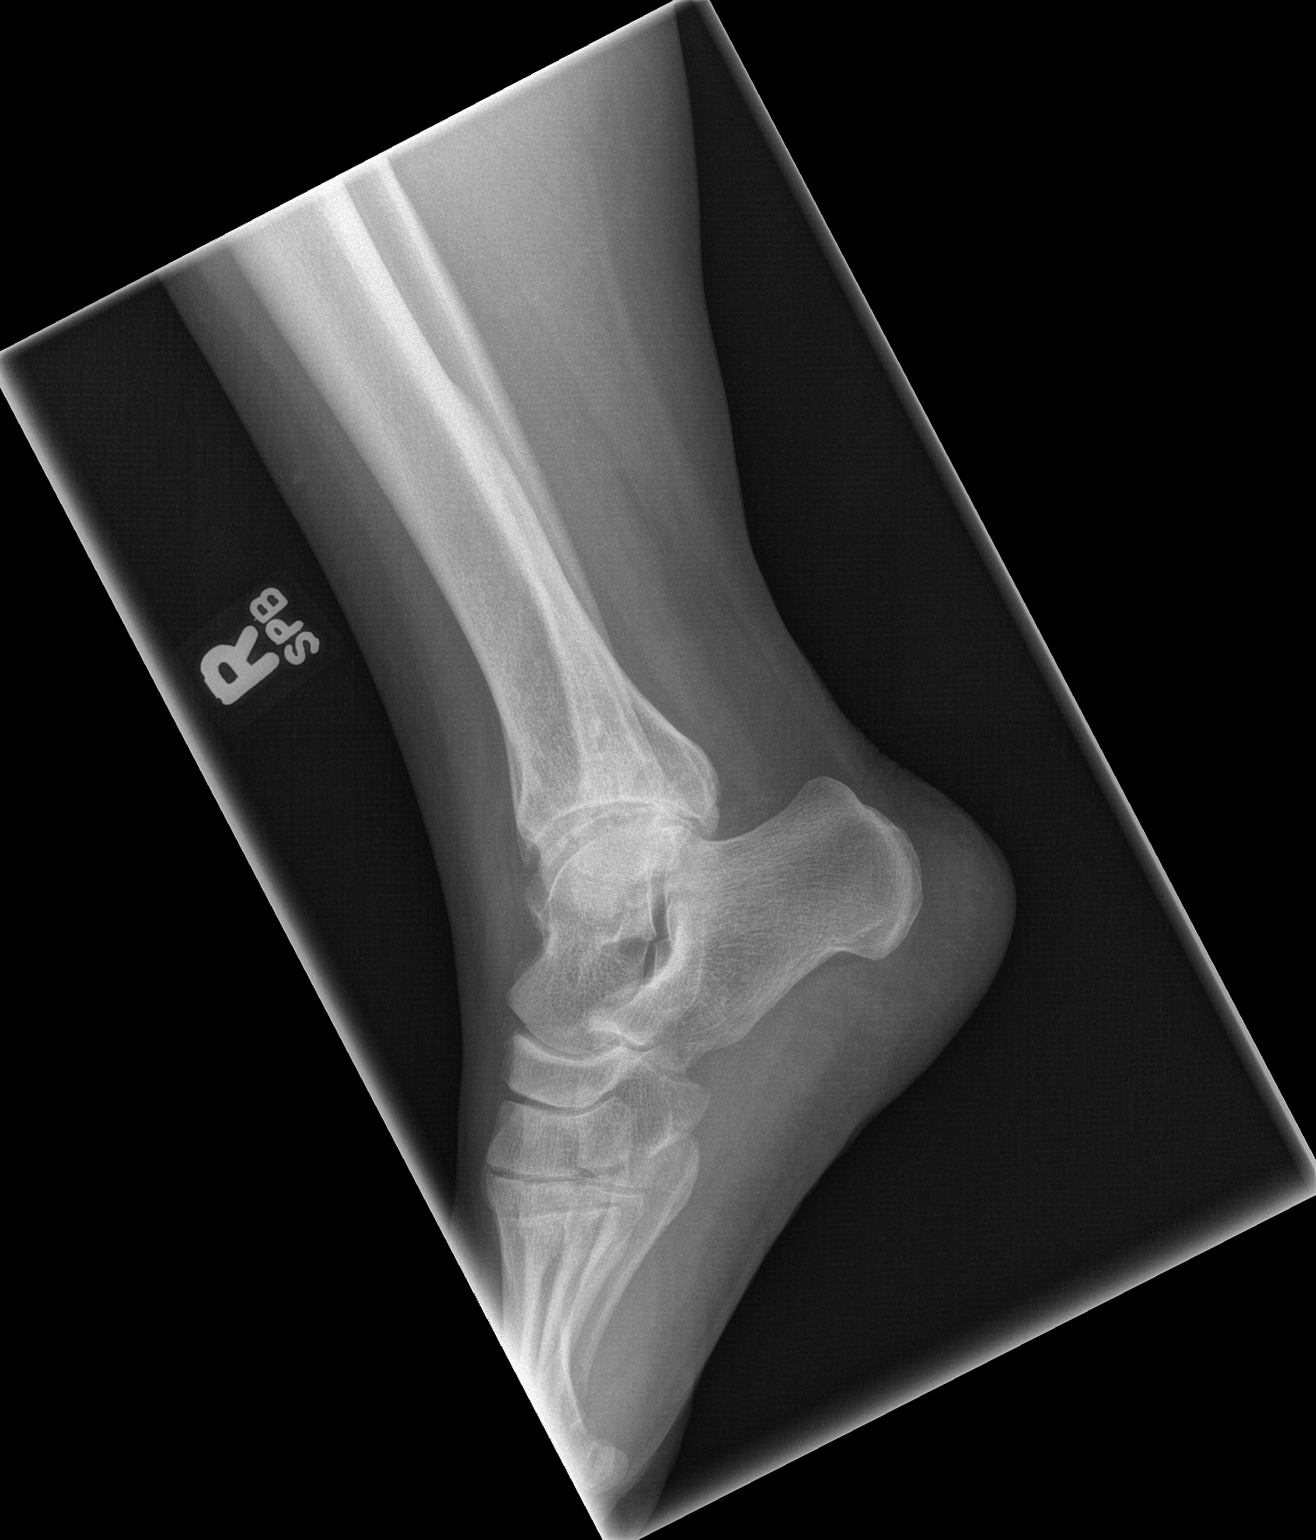

[3 of 3 positions shown; findings below may reference images not displayed]

FINDINGS: There is significant irregularity of the tibia talar
joint, consistent with prior injury and associated degenerative
changes.  There is lucency along the talar dome at its medial
articular surface.  The findings raise the question of
osteochondral defect but could be related to degenerative changes
which are described above.  There is diffuse soft tissue swelling.
No other evidence for acute fracture.
IMPRESSION: 1.  Diffuse soft tissue swelling.
2.  Significant degenerative changes.
3.  Possible osteochondral defect versus superimposed degenerative
changes along the medial aspect of the talar dome.  See above.

## 2014-04-28 IMAGING — CR DG KNEE COMPLETE 4+V*L*
4 series · 4 of 4 positions shown · non-contrast
Comparison: None.

CLINICAL DATA: Fell from steps 2 days ago.  Bilateral anterior knee
abrasions.  The

LEFT KNEE - COMPLETE 4+ VIEW

[t knee ap left]
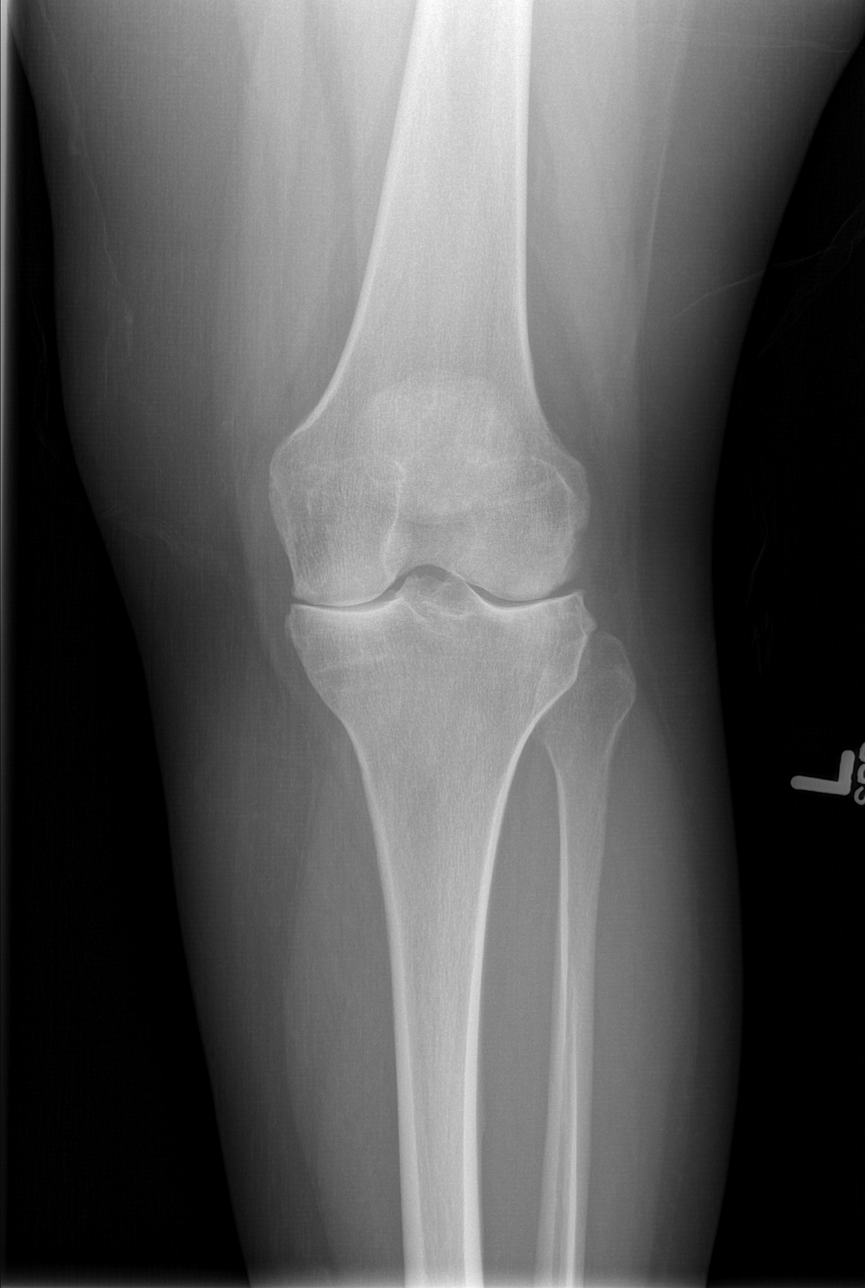

[t knee oblique left (1 of 2)]
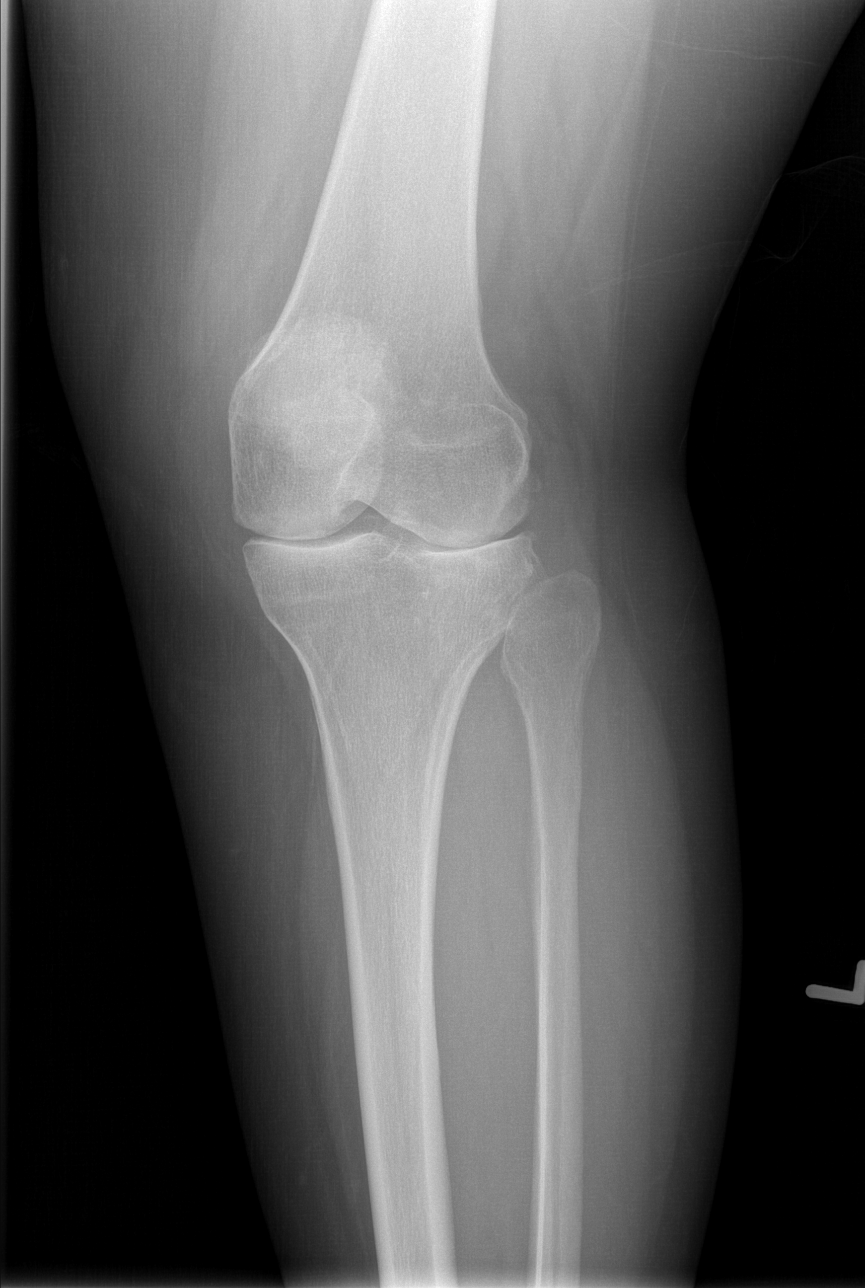

[t knee oblique left (2 of 2)]
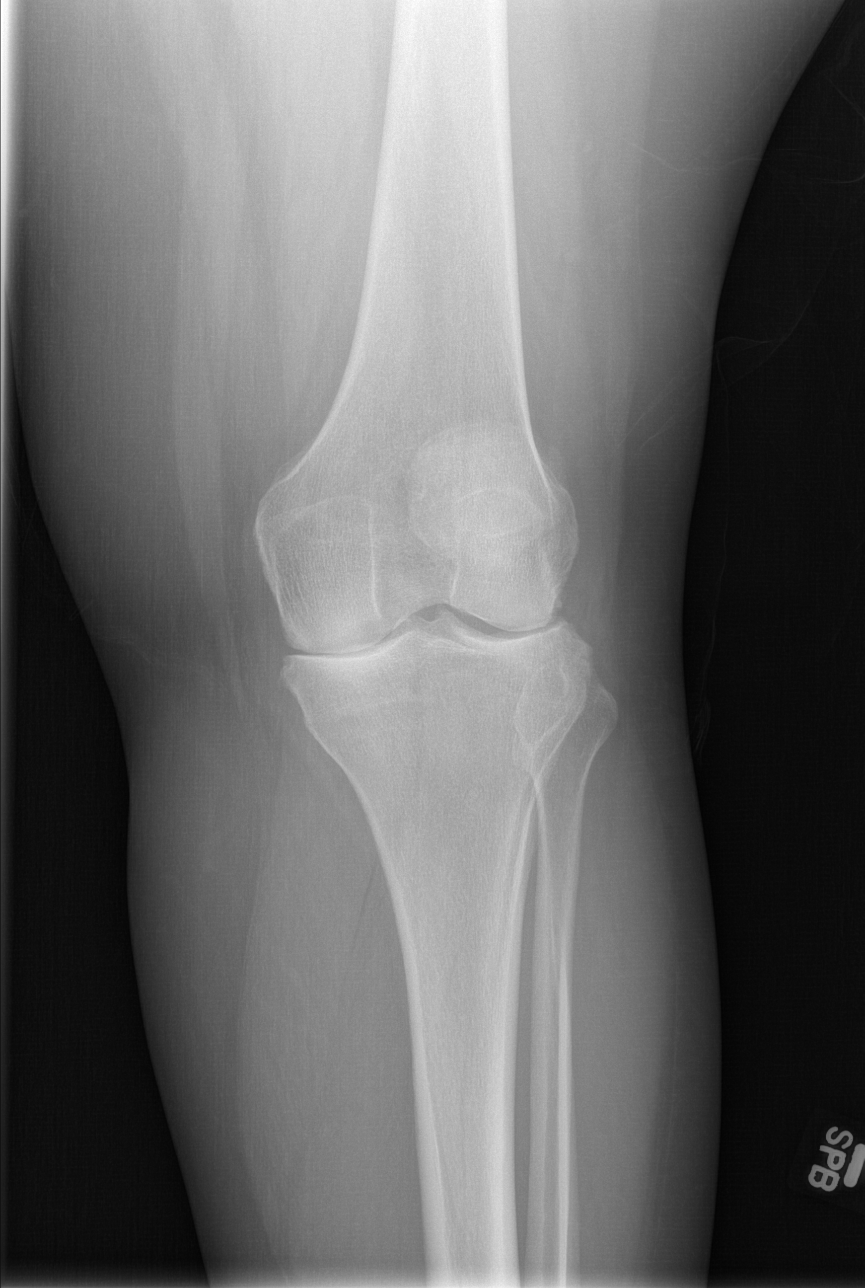

[t knee lat left]
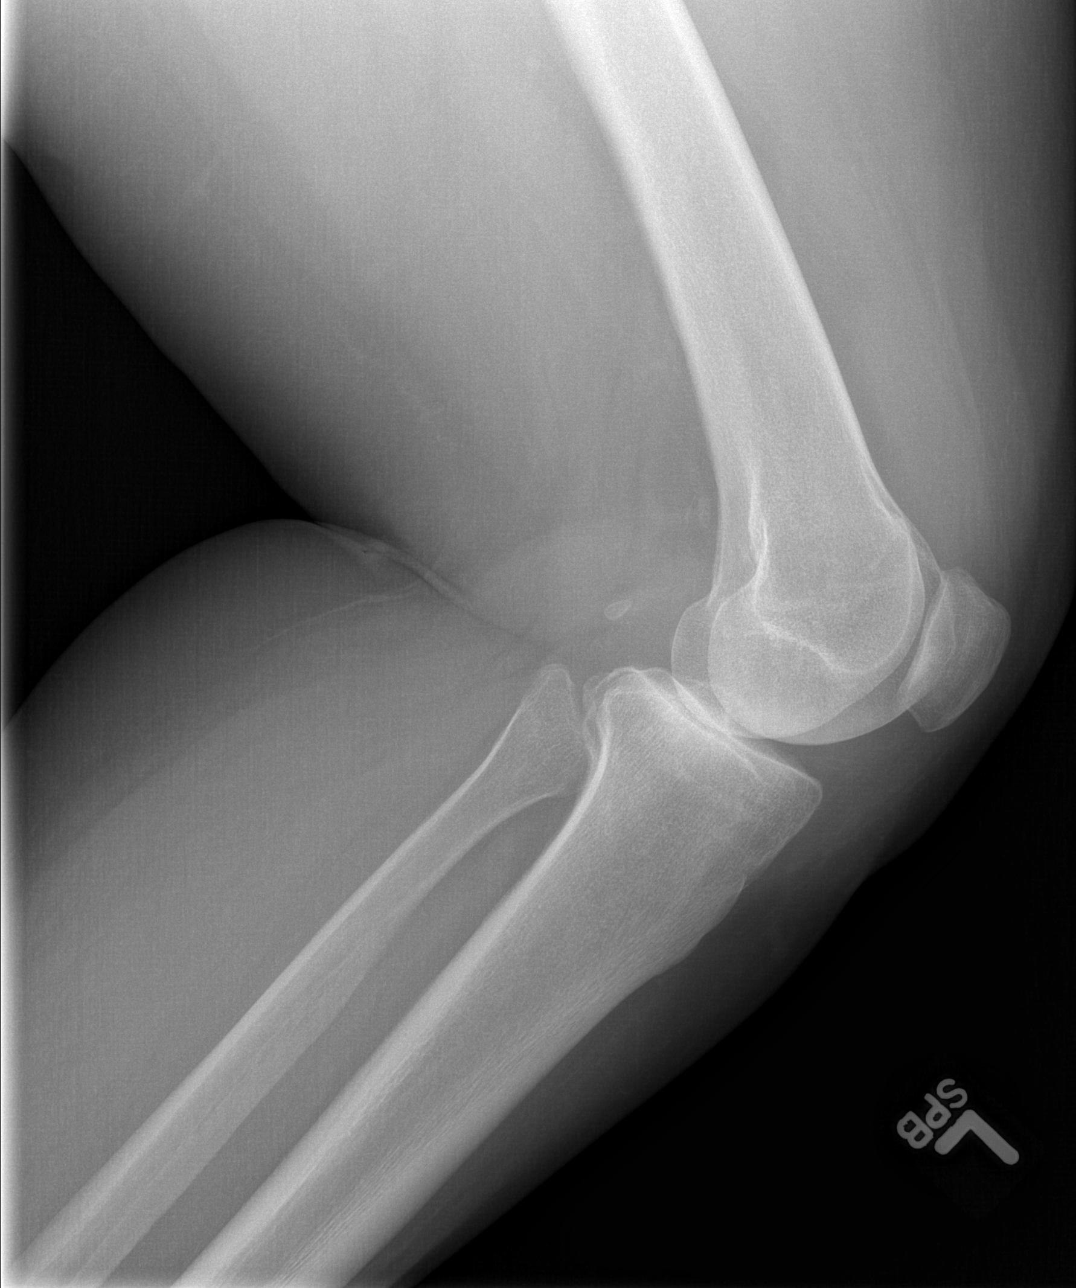

[4 of 4 positions shown; findings below may reference images not displayed]

FINDINGS: There is a small joint effusion.  There is moderate
degenerative change involving the medial, lateral, and
patellofemoral compartments. No evidence for acute fracture or
subluxation.
IMPRESSION: 1.  Degenerative changes.
2.  Joint effusion.
3.  If pain persists, consider follow-up MRI.

## 2014-05-03 ENCOUNTER — Emergency Department (HOSPITAL_BASED_OUTPATIENT_CLINIC_OR_DEPARTMENT_OTHER): Payer: Medicaid Other

## 2014-05-03 ENCOUNTER — Emergency Department (HOSPITAL_BASED_OUTPATIENT_CLINIC_OR_DEPARTMENT_OTHER)
Admission: EM | Admit: 2014-05-03 | Discharge: 2014-05-03 | Disposition: A | Discharge status: Home / Self Care | Payer: Medicaid Other | Attending: Emergency Medicine | Admitting: Emergency Medicine

## 2014-05-03 ENCOUNTER — Encounter (HOSPITAL_BASED_OUTPATIENT_CLINIC_OR_DEPARTMENT_OTHER): Payer: Self-pay | Admitting: Emergency Medicine

## 2014-05-03 DIAGNOSIS — Y939 Activity, unspecified: Secondary | ICD-10-CM | POA: Insufficient documentation

## 2014-05-03 DIAGNOSIS — S90812A Abrasion, left foot, initial encounter: Secondary | ICD-10-CM

## 2014-05-03 DIAGNOSIS — R238 Other skin changes: Secondary | ICD-10-CM

## 2014-05-03 DIAGNOSIS — Z79899 Other long term (current) drug therapy: Secondary | ICD-10-CM | POA: Insufficient documentation

## 2014-05-03 DIAGNOSIS — S90811A Abrasion, right foot, initial encounter: Secondary | ICD-10-CM

## 2014-05-03 DIAGNOSIS — F17200 Nicotine dependence, unspecified, uncomplicated: Secondary | ICD-10-CM | POA: Insufficient documentation

## 2014-05-03 DIAGNOSIS — M1990 Unspecified osteoarthritis, unspecified site: Secondary | ICD-10-CM | POA: Insufficient documentation

## 2014-05-03 DIAGNOSIS — Z791 Long term (current) use of non-steroidal anti-inflammatories (NSAID): Secondary | ICD-10-CM | POA: Insufficient documentation

## 2014-05-03 DIAGNOSIS — Z792 Long term (current) use of antibiotics: Secondary | ICD-10-CM | POA: Insufficient documentation

## 2014-05-03 DIAGNOSIS — Z880 Allergy status to penicillin: Secondary | ICD-10-CM | POA: Insufficient documentation

## 2014-05-03 DIAGNOSIS — Z8614 Personal history of Methicillin resistant Staphylococcus aureus infection: Secondary | ICD-10-CM | POA: Insufficient documentation

## 2014-05-03 DIAGNOSIS — M129 Arthropathy, unspecified: Secondary | ICD-10-CM | POA: Insufficient documentation

## 2014-05-03 DIAGNOSIS — S40029A Contusion of unspecified upper arm, initial encounter: Secondary | ICD-10-CM | POA: Insufficient documentation

## 2014-05-03 DIAGNOSIS — Y929 Unspecified place or not applicable: Secondary | ICD-10-CM | POA: Insufficient documentation

## 2014-05-03 DIAGNOSIS — G40909 Epilepsy, unspecified, not intractable, without status epilepticus: Secondary | ICD-10-CM | POA: Insufficient documentation

## 2014-05-03 DIAGNOSIS — Z7982 Long term (current) use of aspirin: Secondary | ICD-10-CM | POA: Insufficient documentation

## 2014-05-03 DIAGNOSIS — IMO0002 Reserved for concepts with insufficient information to code with codable children: Secondary | ICD-10-CM | POA: Insufficient documentation

## 2014-05-03 DIAGNOSIS — R296 Repeated falls: Secondary | ICD-10-CM | POA: Insufficient documentation

## 2014-05-03 MED ORDER — OXYCODONE-ACETAMINOPHEN 5-325 MG PO TABS
2.0000 | ORAL_TABLET | Freq: Once | ORAL | Status: AC
  Administered 2014-05-03: 2 via ORAL
  Filled 2014-05-03: qty 2

## 2014-05-03 MED ORDER — HYDROMORPHONE HCL PF 1 MG/ML IJ SOLN
1.0000 mg | Freq: Once | INTRAMUSCULAR | Status: AC
  Administered 2014-05-03: 1 mg via INTRAMUSCULAR
  Filled 2014-05-03: qty 1

## 2014-05-03 NOTE — ED Provider Notes (Signed)
CSN: [id number]     Arrival date & time 05/03/14  1806 History   First MD Initiated Contact with Patient 05/03/14 1816     Chief Complaint  Patient presents with  . Ankle Pain     (Consider location/radiation/quality/duration/timing/severity/associated sxs/prior Treatment) Patient is a 33 y.o. female presenting with fall. The history is provided by the patient. No language interpreter was used.  Fall This is a new problem. The current episode started today. The problem occurs constantly. The problem has been unchanged. Associated symptoms include joint swelling and myalgias. Nothing aggravates the symptoms. She has tried nothing for the symptoms. The treatment provided no relief.    Past Medical History  Diagnosis Date  . Arthritis   . MRSA (methicillin resistant Staphylococcus aureus)   . Osteoarthritis   . Seizures     last seizure 11/16/2102   Past Surgical History  Procedure Laterality Date  . Mrsa    . Abdominal surgery    . Knee surgery     History reviewed. No pertinent family history. History  Substance Use Topics  . Smoking status: Current Every Day Smoker -- 0.50 packs/day    Types: Cigarettes  . Smokeless tobacco: Never Used  . Alcohol Use: No   OB History   Grav Para Term Preterm Abortions TAB SAB Ect Mult Living                 Review of Systems  Musculoskeletal: Positive for joint swelling and myalgias.  Skin: Positive for wound.  All other systems reviewed and are negative.     Allergies  Tramadol; Penicillins; and Toradol  Home Medications   Prior to Admission medications   Medication Sig Start Date End Date Taking? Authorizing Provider  acyclovir (ZOVIRAX) 800 MG tablet Take one tablet five times per day for seven days. 09/13/12   Carleene CooperAlan Davidson III, MD  ASPIRIN PO Take by mouth.    Historical Provider, MD  clonazePAM (KLONOPIN) 2 MG tablet Take 4 mg by mouth 2 (two) times daily as needed.    Historical Provider, MD  divalproex (DEPAKOTE)  500 MG EC tablet Take 600 mg by mouth 2 (two) times daily. Take 1 tab in the morning and 2 tabs at night    Historical Provider, MD  DULoxetine (CYMBALTA) 60 MG capsule Take 180 mg by mouth daily.     Historical Provider, MD  HYDROcodone-acetaminophen (NORCO) 10-325 MG per tablet Take 1 tablet by mouth every 6 (six) hours.    Historical Provider, MD  ibuprofen (ADVIL,MOTRIN) 200 MG tablet Take 800 mg by mouth 2 (two) times daily as needed. Patient used this medication for her body pain.    Historical Provider, MD  lidocaine (XYLOCAINE) 2 % solution Take 20 mLs by mouth every 6 (six) hours as needed for pain. 02/10/13   Gerhard Munchobert Lockwood, MD  naproxen sodium (ANAPROX) 220 MG tablet Take 220 mg by mouth 2 (two) times daily with a meal.    Historical Provider, MD  QUEtiapine (SEROQUEL) 300 MG tablet Take 600 mg by mouth at bedtime.     Historical Provider, MD  sulfamethoxazole-trimethoprim (BACTRIM DS) 800-160 MG per tablet Take 1 tablet by mouth 2 (two) times daily.    Historical Provider, MD   BP 136/76  Pulse 81  Temp(Src) 98.2 F (36.8 C) (Oral)  SpO2 100%  LMP 05/03/2014 Physical Exam  Constitutional: She is oriented to person, place, and time. She appears well-developed and well-nourished.  HENT:  Head: Normocephalic.  Eyes: Pupils  are equal, round, and reactive to light.  Musculoskeletal:  Blisters between 1st and 2nd toe, blisters bottom of feet. Swelling left ankle,   Bruises right arm.  IV  Marks right wrist  Neurological: She is alert and oriented to person, place, and time.  Skin: There is erythema.  Psychiatric:  Pt crying and yelling,  Yelling out at staff.     ED Course  Procedures (including critical care time) Labs Review Labs Reviewed - No data to display  Imaging Review No results found.   EKG Interpretation None      MDM   Final diagnoses:  Abrasion of foot, right  Abrasion of foot, left  Blisters of multiple sites    Pt refused xray, Pt request to see  MD,  Dr. Gwendolyn Grant in to see.   Pt given injection of dilaudid.     Elson Areas, PA-C 05/03/14 2013

## 2014-05-03 NOTE — ED Notes (Signed)
Pt c/o right ankle pain and also c/o abscess to between bil big toes x 2 days

## 2014-05-03 NOTE — ED Notes (Signed)
Pt wanted to speak with MD Gwendolyn Grant, I informed RN Sam and MD Gwendolyn Grant

## 2014-05-03 NOTE — Discharge Instructions (Signed)
Blisters Blisters are fluid-filled sacs that form within the skin. Common causes of blistering are friction, burns, and exposure to irritating chemicals. The fluid in the blister protects the underlying damaged skin. Most of the time it is not recommended that you open blisters. When a blister is opened, there is an increased chance for infection. Usually, a blister will open on its own. They then dry up and peel off within 10 days. If the blister is tense and uncomfortable (painful) the fluid may be drained. If it is drained the roof of the blister should be left intact. The draining should only be done by a medical professional under aseptic conditions. Poorly fitting shoes and boots can cause blisters by being too tight or too loose. Wearing extra socks or using tape, bandages, or pads over the blister-prone area helps prevent the problem by reducing friction. Blisters heal more slowly if you have diabetes or if you have problems with your circulation. You need to be careful about medical follow-up to prevent infection. HOME CARE INSTRUCTIONS  Protect areas where blisters have formed until the skin is healed. Use a special bandage with a hole cut in the middle around the blister. This reduces pressure and friction. When the blister breaks, trim off the loose skin and keep the area clean by washing it with soap daily. Soaking the blister or broken-open blister with diluted vinegar twice daily for 15 minutes will dry it up and speed the healing. Use 3 tablespoons of white vinegar per quart of water (45 mL white vinegar per liter of water). An antibiotic ointment and a bandage can be used to cover the area after soaking.  SEEK MEDICAL CARE IF:   You develop increased redness, pain, swelling, or drainage in the blistered area.  You develop a pus-like discharge from the blistered area, chills, or a fever. MAKE SURE YOU:   Understand these instructions.  Will watch your condition.  Will get help right  away if you are not doing well or get worse. Document Released: 12/18/2004 Document Revised: 02/02/2012 Document Reviewed: 11/15/2008 [facility] Patient Information 2014 [place], [place].  Abrasion An abrasion is a cut or scrape of the skin. Abrasions do not extend through all layers of the skin and most heal within 10 days. It is important to care for your abrasion properly to prevent infection. CAUSES  Most abrasions are caused by falling on, or gliding across, the ground or other surface. When your skin rubs on something, the outer and inner layer of skin rubs off, causing an abrasion. DIAGNOSIS  Your caregiver will be able to diagnose an abrasion during a physical exam.  TREATMENT  Your treatment depends on how large and deep the abrasion is. Generally, your abrasion will be cleaned with water and a mild soap to remove any dirt or debris. An antibiotic ointment may be put over the abrasion to prevent an infection. A bandage (dressing) may be wrapped around the abrasion to keep it from getting dirty.  You may need a tetanus shot if:  You cannot remember when you had your last tetanus shot.  You have never had a tetanus shot.  The injury broke your skin. If you get a tetanus shot, your arm may swell, get red, and feel warm to the touch. This is common and not a problem. If you need a tetanus shot and you choose not to have one, there is a rare chance of getting tetanus. Sickness from tetanus can be serious.  HOME CARE INSTRUCTIONS  If a dressing was applied, change it at least once a day or as directed by your caregiver. If the bandage sticks, soak it off with warm water.   Wash the area with water and a mild soap to remove all the ointment 2 times a day. Rinse off the soap and pat the area dry with a clean towel.   Reapply any ointment as directed by your caregiver. This will help prevent infection and keep the bandage from sticking. Use gauze over the wound and under the dressing to  help keep the bandage from sticking.   Change your dressing right away if it becomes wet or dirty.   Only take over-the-counter or prescription medicines for pain, discomfort, or fever as directed by your caregiver.   Follow up with your caregiver within 24 48 hours for a wound check, or as directed. If you were not given a wound-check appointment, look closely at your abrasion for redness, swelling, or pus. These are signs of infection. SEEK IMMEDIATE MEDICAL CARE IF:   You have increasing pain in the wound.   You have redness, swelling, or tenderness around the wound.   You have pus coming from the wound.   You have a fever or persistent symptoms for more than 2 3 days.  You have a fever and your symptoms suddenly get worse.  You have a bad smell coming from the wound or dressing.  MAKE SURE YOU:   Understand these instructions.  Will watch your condition.  Will get help right away if you are not doing well or get worse. Document Released: 08/20/2005 Document Revised: 10/27/2012 Document Reviewed: 10/14/2011 [facility] Patient Information 2014 [place], [place]. Chronic Pain Chronic pain can be defined as pain that is off and on and lasts for 3 6 months or longer. Many things cause chronic pain, which can make it difficult to make a diagnosis. There are many treatment options available for chronic pain. However, finding a treatment that works well for you may require trying various approaches until the right one is found. Many people benefit from a combination of two or more types of treatment to control their pain. SYMPTOMS  Chronic pain can occur anywhere in the body and can range from mild to very severe. Some types of chronic pain include:  Headache.  Low back pain.  Cancer pain.  Arthritis pain.  Neurogenic pain. This is pain resulting from damage to nerves. People with chronic pain may also have other symptoms such  as:  Depression.  Anger.  Insomnia.  Anxiety. DIAGNOSIS  Your health care provider will help diagnose your condition over time. In many cases, the initial focus will be on excluding possible conditions that could be causing the pain. Depending on your symptoms, your health care provider may order tests to diagnose your condition. Some of these tests may include:   Blood tests.   CT scan.   MRI.   X-rays.   Ultrasounds.   Nerve conduction studies.  You may need to see a specialist.  TREATMENT  Finding treatment that works well may take time. You may be referred to a pain specialist. He or she may prescribe medicine or therapies, such as:   Mindful meditation or yoga.  Shots (injections) of numbing or pain-relieving medicines into the spine or area of pain.  Local electrical stimulation.  Acupuncture.   Massage therapy.   Aroma, color, light, or sound therapy.   Biofeedback.   Working with a physical therapist to keep from getting  stiff.   Regular, gentle exercise.   Cognitive or behavioral therapy.   Group support.  Sometimes, surgery may be recommended.  HOME CARE INSTRUCTIONS   Take all medicines as directed by your health care provider.   Lessen stress in your life by relaxing and doing things such as listening to calming music.   Exercise or be active as directed by your health care provider.   Eat a healthy diet and include things such as vegetables, fruits, fish, and lean meats in your diet.   Keep all follow-up appointments with your health care provider.   Attend a support group with others suffering from chronic pain. SEEK MEDICAL CARE IF:   Your pain gets worse.   You develop a new pain that was not there before.   You cannot tolerate medicines given to you by your health care provider.   You have new symptoms since your last visit with your health care provider.  SEEK IMMEDIATE MEDICAL CARE IF:   You feel weak.    You have decreased sensation or numbness.   You lose control of bowel or bladder function.   Your pain suddenly gets much worse.   You develop shaking.  You develop chills.  You develop confusion.  You develop chest pain.  You develop shortness of breath.  MAKE SURE YOU:  Understand these instructions.  Will watch your condition.  Will get help right away if you are not doing well or get worse. Document Released: 08/02/2002 Document Revised: 07/13/2013 Document Reviewed: 05/06/2013 [facility] Patient Information 2014 [place].

## 2014-05-04 NOTE — ED Provider Notes (Signed)
Medical screening examination/treatment/procedure(s) were conducted as a shared visit with non-physician practitioner(s) and myself.  I personally evaluated the patient during the encounter.   EKG Interpretation None       Patient with chronic pain, has f/u to see Ortho for joint replacements. Here with large amount of pain after walking yesterday. She had a bad experience with PA.  No bony deformities. Multiple blisters on her feet, no abscess. Given 1 mg of Dilaudid. No pain meds to go home.  Dagmar Hait, MD 05/04/14 [phone]

## 2014-06-14 ENCOUNTER — Telehealth: Payer: Self-pay | Admitting: Neurology

## 2014-06-14 ENCOUNTER — Encounter: Payer: Self-pay | Admitting: Neurology

## 2014-06-14 ENCOUNTER — Ambulatory Visit (INDEPENDENT_AMBULATORY_CARE_PROVIDER_SITE_OTHER): Payer: Medicaid Other | Admitting: Neurology

## 2014-06-14 VITALS — BP 110/75 | HR 107 | Ht 61.0 in | Wt 201.0 lb

## 2014-06-14 DIAGNOSIS — G40219 Localization-related (focal) (partial) symptomatic epilepsy and epileptic syndromes with complex partial seizures, intractable, without status epilepticus: Secondary | ICD-10-CM | POA: Insufficient documentation

## 2014-06-14 DIAGNOSIS — G40319 Generalized idiopathic epilepsy and epileptic syndromes, intractable, without status epilepticus: Secondary | ICD-10-CM

## 2014-06-14 MED ORDER — DIVALPROEX SODIUM 500 MG PO DR TAB: 500.0000 mg | DELAYED_RELEASE_TABLET | Freq: Two times a day (BID) | ORAL | Status: DC

## 2014-06-14 MED ORDER — CLONAZEPAM 2 MG PO TABS: 2.0000 mg | ORAL_TABLET | Freq: Two times a day (BID) | ORAL | Status: DC

## 2014-06-14 MED ORDER — LEVETIRACETAM 750 MG PO TABS
750.0000 mg | ORAL_TABLET | Freq: Two times a day (BID) | ORAL | Status: DC
Start: 2014-06-14 — End: 2014-08-10

## 2014-06-14 NOTE — Telephone Encounter (Signed)
Patient says the Depakote that was called in today has the wrong dosage--patient takes 2 pills every 12 hours--[facility] [address]

## 2014-06-14 NOTE — Telephone Encounter (Signed)
Rx for Depakote was sent for one in the morning and two at night.  Patient says she needs this changed to two tabs twice daily.  Please advise.  Thank you.
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
## 2014-06-14 NOTE — Patient Instructions (Signed)
I had a long discussion with the patient regarding her recurrent seizures, need for being compliant with her medications and not running out and calling the head for refills. Continue clonazepam 2 mg twice daily, Depakote 1000 milligrams twice daily and Keppra 750 twice daily. Check EEG, CMP, CBC, valproic acid and ammonia levels. Do not drive. Return for followup in 2 months with Heide GuileLynn Lam, NP.or  call earlier if necessary  Epilepsy Epilepsy is a disorder in which a person has repeated seizures over time. A seizure is a release of abnormal electrical activity in the brain. Seizures can cause a change in attention, behavior, or the ability to remain awake and alert (altered mental status). Seizures often involve uncontrollable shaking (convulsions).  Most people with epilepsy lead normal lives. However, people with epilepsy are at an increased risk of falls, accidents, and injuries. Therefore, it is important to begin treatment right away. CAUSES  Epilepsy has many possible causes. Anything that disturbs the normal pattern of brain cell activity can lead to seizures. This may include:   Head injury.  Birth trauma.  High fever as a child.  Stroke.  Bleeding into or around the brain.  Certain drugs.  Prolonged low oxygen, such as what occurs after CPR efforts.  Abnormal brain development.  Certain illnesses, such as meningitis, encephalitis (brain infection), malaria, and other infections.  An imbalance of nerve signaling chemicals (neurotransmitters).  SIGNS AND SYMPTOMS  The symptoms of a seizure can vary greatly from one person to another. Right before a seizure, you may have a warning (aura) that a seizure is about to occur. An aura may include the following symptoms:  Fear or anxiety.  Nausea.  Feeling like the room is spinning (vertigo).  Vision changes, such as seeing flashing lights or spots. Common symptoms during a seizure include:  Abnormal sensations, such as an  abnormal smell or a bitter taste in the mouth.   Sudden, general body stiffness.   Convulsions that involve rhythmic jerking of the face, arm, or leg on one or both sides.   Sudden change in consciousness.   Appearing to be awake but not responding.   Appearing to be asleep but cannot be awakened.   Grimacing, chewing, lip smacking, drooling, tongue biting, or loss of bowel or bladder control. After a seizure, you may feel sleepy for a while. DIAGNOSIS  Your health care provider will ask about your symptoms and take a medical history. Descriptions from any witnesses to your seizures will be very helpful in the diagnosis. A physical exam, including a detailed neurological exam, is necessary. Various tests may be done, such as:   An electroencephalogram (EEG). This is a painless test of your brain waves. In this test, a diagram is created of your brain waves. These diagrams can be interpreted by a specialist.  An MRI of the brain.   A CT scan of the brain.   A spinal tap (lumbar puncture, LP).  Blood tests to check for signs of infection or abnormal blood chemistry. TREATMENT  There is no cure for epilepsy, but it is generally treatable. Once epilepsy is diagnosed, it is important to begin treatment as soon as possible. For most people with epilepsy, seizures can be controlled with medicines. The following may also be used:  A pacemaker for the brain (vagus nerve stimulator) can be used for people with seizures that are not well controlled by medicine.  Surgery on the brain. For some people, epilepsy eventually goes away. HOME CARE INSTRUCTIONS  Follow your health care provider's recommendations on driving and safety in normal activities.  Get enough rest. Lack of sleep can cause seizures.  Only take over-the-counter or prescription medicines as directed by your health care provider. Take any prescribed medicine exactly as directed.  Avoid any known triggers of your  seizures.  Keep a seizure diary. Record what you recall about any seizure, especially any possible trigger.   Make sure the people you live and work with know that you are prone to seizures. They should receive instructions on how to help you. In general, a witness to a seizure should:   Cushion your head and body.   Turn you on your side.   Avoid unnecessarily restraining you.   Not place anything inside your mouth.   Call for emergency medical help if there is any question about what has occurred.   Follow up with your health care provider as directed. You may need regular blood tests to monitor the levels of your medicine.  SEEK MEDICAL CARE IF:   You develop signs of infection or other illness. This might increase the risk of a seizure.   You seem to be having more frequent seizures.   Your seizure pattern is changing.  SEEK IMMEDIATE MEDICAL CARE IF:   You have a seizure that does not stop after a few moments.   You have a seizure that causes any difficulty in breathing.   You have a seizure that results in a very severe headache.   You have a seizure that leaves you with the inability to speak or use a part of your body.  Document Released: 11/10/2005 Document Revised: 08/31/2013 Document Reviewed: 06/22/2013 [facility] Patient Information 2015 [place], [place]. This information is not intended to replace advice given to you by your health care provider. Make sure you discuss any questions you have with your health care provider.

## 2014-06-15 ENCOUNTER — Telehealth: Payer: Self-pay | Admitting: Neurology

## 2014-06-15 MED ORDER — DIVALPROEX SODIUM 500 MG PO DR TAB: 1000.0000 mg | DELAYED_RELEASE_TABLET | Freq: Two times a day (BID) | ORAL | Status: DC

## 2014-06-15 NOTE — Telephone Encounter (Signed)
Patient calling to request a referral to a pain management clinic, please return call and advise.

## 2014-06-15 NOTE — Telephone Encounter (Signed)
OV notes say: Depakote 1000 milligrams twice daily  Rx has been updated and resent

## 2014-06-15 NOTE — Telephone Encounter (Signed)
Please advise previous note. Thanks  °

## 2014-06-16 NOTE — Telephone Encounter (Signed)
Ask patient to get primary Md to do so as I am seeing her for seizures

## 2014-06-19 NOTE — Telephone Encounter (Signed)
Called pt and left message informing her per Dr. Pearlean BrownieSethi that the pt needed to get her PCP to refer pt to pain management clinic, because Dr. Pearlean BrownieSethi is seeing pt for seizures. I advised the pt that if she has any other problems, questions or concerns to call the office.

## 2014-06-21 ENCOUNTER — Telehealth: Payer: Self-pay | Admitting: Neurology

## 2014-06-21 NOTE — Telephone Encounter (Signed)
Called pt to inform her that per Rx instructions that pt is supposed to be taking clonazepam 2 mg 1 tablet twice a day and that the pt was taking Depakote 2 tablets twice day correctly. I advised the pt that if she has any other problems, questions or concerns to call the office. Pt verbalized understanding. FYI

## 2014-06-21 NOTE — Telephone Encounter (Signed)
Patient calling to state that on her clonazepam, the instructions say to only take 2 a day, patient states she has been taking 4 a day and she has been doing well. Patient also states that her Depakote says the same thing, to take 2 a day but she was previously taking 2 every 12 hours and was doing very well on that dose. Please return call to patient and advise about dosage.

## 2014-06-28 ENCOUNTER — Telehealth: Payer: Self-pay | Admitting: Neurology

## 2014-06-28 NOTE — Telephone Encounter (Signed)
Patient calling to state that she really feels like she needs to take her Clonazepam 4 times a day because they help with her seizures, please return call to patient and advise.

## 2014-06-29 NOTE — Telephone Encounter (Signed)
Pt calling stating that she would like to take her Clonazepam 2 mg 4 times a day. I informed the pt that she is supposed to be taking it twice a day. Pt states that taking it 4 times a day helps with her seizures. Please advise

## 2014-06-29 NOTE — Telephone Encounter (Signed)
Clonazepam cannot be taken 4 times daily. Stick to twice daily as prescribed. Increase Depakote instead to 500 mg two tablets twice daily for increased seizures

## 2014-06-30 ENCOUNTER — Other Ambulatory Visit: Payer: Self-pay | Admitting: *Deleted

## 2014-06-30 DIAGNOSIS — G40319 Generalized idiopathic epilepsy and epileptic syndromes, intractable, without status epilepticus: Secondary | ICD-10-CM

## 2014-06-30 NOTE — Telephone Encounter (Signed)
I called the patient back, got no answer.  Left message.  

## 2014-06-30 NOTE — Progress Notes (Signed)
Pt to come in for lab draw per Dr. Pearlean BrownieSethi request.  Pt taking depakote 1000mg  po bid.

## 2014-06-30 NOTE — Telephone Encounter (Signed)
Called pt to inform her per Dr. Pearlean BrownieSethi that the pt should continue to take Clonazepam as prescribe twice daily and that Dr. Pearlean BrownieSethi would like for the pt to come in to have her blood drawn to check her valproic acid level and that the pt could come in to have that drawn as long as the pt come in before taking her Depakote medication. I advised the pt that if she has any other problems, questions or concerns to call the office. Pt verbalized understanding.

## 2014-07-05 DIAGNOSIS — L03119 Cellulitis of unspecified part of limb: Secondary | ICD-10-CM | POA: Insufficient documentation

## 2014-07-05 DIAGNOSIS — L02419 Cutaneous abscess of limb, unspecified: Secondary | ICD-10-CM | POA: Insufficient documentation

## 2014-07-05 DIAGNOSIS — R351 Nocturia: Secondary | ICD-10-CM | POA: Insufficient documentation

## 2014-07-05 DIAGNOSIS — IMO0002 Reserved for concepts with insufficient information to code with codable children: Secondary | ICD-10-CM | POA: Insufficient documentation

## 2014-07-05 DIAGNOSIS — F309 Manic episode, unspecified: Secondary | ICD-10-CM | POA: Insufficient documentation

## 2014-07-05 DIAGNOSIS — R450 Nervousness: Secondary | ICD-10-CM | POA: Insufficient documentation

## 2014-07-05 DIAGNOSIS — M1990 Unspecified osteoarthritis, unspecified site: Secondary | ICD-10-CM | POA: Insufficient documentation

## 2014-07-05 DIAGNOSIS — R7689 Other specified abnormal immunological findings in serum: Secondary | ICD-10-CM | POA: Insufficient documentation

## 2014-07-05 DIAGNOSIS — R768 Other specified abnormal immunological findings in serum: Secondary | ICD-10-CM | POA: Insufficient documentation

## 2014-07-05 DIAGNOSIS — R32 Unspecified urinary incontinence: Secondary | ICD-10-CM | POA: Insufficient documentation

## 2014-07-05 DIAGNOSIS — L0231 Cutaneous abscess of buttock: Secondary | ICD-10-CM | POA: Insufficient documentation

## 2014-07-05 DIAGNOSIS — F980 Enuresis not due to a substance or known physiological condition: Secondary | ICD-10-CM | POA: Insufficient documentation

## 2014-07-05 DIAGNOSIS — M797 Fibromyalgia: Secondary | ICD-10-CM | POA: Insufficient documentation

## 2014-07-05 DIAGNOSIS — Z8619 Personal history of other infectious and parasitic diseases: Secondary | ICD-10-CM | POA: Insufficient documentation

## 2014-07-05 DIAGNOSIS — G43009 Migraine without aura, not intractable, without status migrainosus: Secondary | ICD-10-CM | POA: Insufficient documentation

## 2014-07-05 DIAGNOSIS — G4700 Insomnia, unspecified: Secondary | ICD-10-CM | POA: Insufficient documentation

## 2014-07-05 DIAGNOSIS — L0291 Cutaneous abscess, unspecified: Secondary | ICD-10-CM | POA: Insufficient documentation

## 2014-07-05 DIAGNOSIS — M1991 Primary osteoarthritis, unspecified site: Secondary | ICD-10-CM | POA: Insufficient documentation

## 2014-07-05 DIAGNOSIS — N760 Acute vaginitis: Secondary | ICD-10-CM

## 2014-07-05 DIAGNOSIS — N766 Ulceration of vulva: Secondary | ICD-10-CM | POA: Insufficient documentation

## 2014-07-05 DIAGNOSIS — L0390 Cellulitis, unspecified: Secondary | ICD-10-CM | POA: Insufficient documentation

## 2014-07-05 DIAGNOSIS — B9689 Other specified bacterial agents as the cause of diseases classified elsewhere: Secondary | ICD-10-CM | POA: Insufficient documentation

## 2014-07-05 DIAGNOSIS — A5900 Urogenital trichomoniasis, unspecified: Secondary | ICD-10-CM | POA: Insufficient documentation

## 2014-07-05 DIAGNOSIS — L02211 Cutaneous abscess of abdominal wall: Secondary | ICD-10-CM | POA: Insufficient documentation

## 2014-07-05 DIAGNOSIS — S83289A Other tear of lateral meniscus, current injury, unspecified knee, initial encounter: Secondary | ICD-10-CM | POA: Insufficient documentation

## 2014-07-05 DIAGNOSIS — N739 Female pelvic inflammatory disease, unspecified: Secondary | ICD-10-CM | POA: Insufficient documentation

## 2014-07-05 DIAGNOSIS — R350 Frequency of micturition: Secondary | ICD-10-CM | POA: Insufficient documentation

## 2014-07-11 ENCOUNTER — Other Ambulatory Visit: Payer: Self-pay | Admitting: Neurology

## 2014-07-11 MED ORDER — CLONAZEPAM 2 MG PO TABS: 2.0000 mg | ORAL_TABLET | Freq: Two times a day (BID) | ORAL | Status: DC

## 2014-07-11 NOTE — Telephone Encounter (Signed)
Patient requesting refill of clonazepam. She has 2 days left. [phone] and it is okay to leave a message

## 2014-07-17 ENCOUNTER — Telehealth: Payer: Self-pay | Admitting: Neurology

## 2014-07-17 NOTE — Telephone Encounter (Signed)
I called the pharmacy.  Spoke with pharmacist who said Rx was last filled on 07/22.  The Rx written on 08/18 was saved to the patient's file.  They will fill Rx today and contact patient when it's ready for pick up.  I spoke with the patient, she is aware.  Will call us back if anything further is needed.

## 2014-07-17 NOTE — Telephone Encounter (Signed)
Patient requesting another Rx refill for clonazePAM (KLONOPIN) 2 MG tablet due to accidentally through away.  Please call and advise anytime, leave message if not available.

## 2014-07-18 ENCOUNTER — Telehealth: Payer: Self-pay | Admitting: Neurology

## 2014-07-18 NOTE — Telephone Encounter (Signed)
Patient called to confirm appointment for EEG on 08-10-14, confirmed appointment.

## 2014-08-10 ENCOUNTER — Ambulatory Visit (INDEPENDENT_AMBULATORY_CARE_PROVIDER_SITE_OTHER): Payer: Medicaid Other | Admitting: Nurse Practitioner

## 2014-08-10 ENCOUNTER — Other Ambulatory Visit: Payer: Self-pay | Admitting: Nurse Practitioner

## 2014-08-10 ENCOUNTER — Other Ambulatory Visit (INDEPENDENT_AMBULATORY_CARE_PROVIDER_SITE_OTHER): Payer: Medicaid Other

## 2014-08-10 ENCOUNTER — Encounter: Payer: Self-pay | Admitting: Nurse Practitioner

## 2014-08-10 ENCOUNTER — Ambulatory Visit: Payer: Medicaid Other | Admitting: Nurse Practitioner

## 2014-08-10 ENCOUNTER — Encounter (INDEPENDENT_AMBULATORY_CARE_PROVIDER_SITE_OTHER): Payer: Self-pay

## 2014-08-10 VITALS — BP 119/68 | HR 80 | Ht 61.0 in | Wt 201.4 lb

## 2014-08-10 DIAGNOSIS — Z5181 Encounter for therapeutic drug level monitoring: Secondary | ICD-10-CM

## 2014-08-10 DIAGNOSIS — G40319 Generalized idiopathic epilepsy and epileptic syndromes, intractable, without status epilepticus: Secondary | ICD-10-CM

## 2014-08-10 DIAGNOSIS — G40219 Localization-related (focal) (partial) symptomatic epilepsy and epileptic syndromes with complex partial seizures, intractable, without status epilepticus: Secondary | ICD-10-CM

## 2014-08-10 DIAGNOSIS — G4700 Insomnia, unspecified: Secondary | ICD-10-CM

## 2014-08-10 MED ORDER — QUETIAPINE FUMARATE 300 MG PO TABS: 600.0000 mg | ORAL_TABLET | Freq: Every day | ORAL | Status: DC

## 2014-08-10 MED ORDER — CLONAZEPAM 2 MG PO TABS: 2.0000 mg | ORAL_TABLET | Freq: Two times a day (BID) | ORAL | Status: DC

## 2014-08-10 MED ORDER — DIVALPROEX SODIUM 500 MG PO DR TAB: 1000.0000 mg | DELAYED_RELEASE_TABLET | Freq: Two times a day (BID) | ORAL | Status: DC

## 2014-08-10 MED ORDER — LEVETIRACETAM 750 MG PO TABS: 750.0000 mg | ORAL_TABLET | Freq: Two times a day (BID) | ORAL | Status: DC
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
## 2014-08-10 NOTE — Patient Instructions (Addendum)
Continue clonazepam 2 mg twice daily, Depakote 1000 milligrams twice daily and Keppra 750 twice daily.  Check CMP, CBC, valproic acid and ammonia levels.  Do not drive.  We will call you with your test results. Return for followup in 6 months with Dr. Pearlean Brownie or call earlier if necessary.  Managing Seizure Triggers: Tips for Lifestyle Modification Adapted from the [facility], [facility], [place], [place] and the journal "Clinical Nursing Practice in Epilepsy", Spring 1994.  Developing plans to modify your lifestyle is an important part of seizure preparedness. It's a way that you, as a person with seizures or a parent of a child with seizures, can take charge and play an active role in your epilepsy care. The following tips are examples of what people can do to manage triggers. Some of these tips may require a change in behavior, others may be ways to adjust your environment or schedule so not everything happens at once. Before choosing tips to try, make sure you've assessed your situation and talked to your doctor and other health care professionals for their suggestions too. Please note that research on the effectiveness of many of these techniques is limited. Many of these tips are common sense suggestions or are from health care professionals and people with epilepsy as to what they have seen and tried.  Noises: People who think they are affected by noises should be sure to talk to their doctor about whether they have a form of 'reflex epilepsy' or if general noise or distraction may be a trigger in another way. People with true reflex epilepsy may respond to specific seizure medicines and should talk to their doctor. Try using earplugs or earphones, especially in noisy or crowded places. Try listening to relaxing music or sounds, or try distracting yourself by singing or focusing on another activity.  Bright, flashing or fluorescent lights:  Use polarized or tinted glasses. Use natural lighting when indoors. Focus on distant objects when riding in a car to avoid flickering lights or patterns. Avoid discos, strobe lights or flashing bulbs on holiday decorations. Use computer monitor with minimal contrast glare or use a screen filter. Consult with your doctor about other specific recommendations for computer use.  Sleep: Try to regulate sleeping habits so you have a consistent schedule and get enough sleep. Keep a log or diary of your sleep patterns, seizures and general well-being. Ask a partner or companion to record his or her observations too. Consider the following ideas to improve sleep.  . Discuss your medicine schedule with your doctor or nurse. Changing times or doses at night may help sleep. . Limit caffeine and try to avoid it after noon time or mid?afternoon at the latest. . Avoid alcohol and nicotine prior to sleep. . Limit working or studying late at night. Stop work at least one hour before bedtime to allow time to relax. . Exercise in the early evening if possible. . Take warm showers or have someone give you a back rub before bedtime to decrease muscle tension. . Try relaxation exercises before bedtime. . Limit naps and don't nap in the early evening. . If anxious or worried, talk to someone or write down your feelings before going to sleep. Put this away and deal with these worries or concerns in the morning! . If you can't fall asleep within 15 minutes get up and do something else for 15 minutes. Then go back to bed and try again. Don't toss and turn in bed  all night.  Exercise: Regular exercise is good for everyone. Pace your exercise to avoid getting too tired or hyperventilation. Avoid exercising in the middle of the day during hot weather. Ask your doctor about any specific exercises you may need to avoid.  Hyperventilation: Try relaxation or slow breathing exercises when anxious or if you begin  to hyperventilate. Pace your activity and avoid sports that may trigger hyperventilation.  Diet: Regulate meal times and patterns around sleep, activity, and medication schedules. Usually taking medicines after food or around meals makes it easier to remember them and may lessen any stomach distress from side effects of medicines. Have a well-balanced diet and eat at consistent times to avoid long periods without food. If your appetite is poor, try small frequent meals instead of skipping meals. Avoid foods and drinks that may aggravate seizures. Not everyone is sensitive to foods, but if you are, talk to your doctor about how to modify your diet. If you are following a diet specifically for your epilepsy, be sure to follow the advice of your doctor and nutritionist.  Alcohol/Drugs: Avoid recreational drugs and talk to your doctor about use of alcohol. Avoid alcohol completely if you're going through high-risk times or have recently had surgery. If you choose to drink alcohol, use 'moderation', drink slowly, and have only one or two glasses at a time. Consider carefully what you drink, avoiding 'hard liquor' or mixed drinks that may have high alcohol content. If alcohol and drugs are a problem for you, talk to your doctor and get professional help.  Hormonal changes: Both men and women may notice a cyclical pattern to their seizures. Record seizures on a calendar and track them in relation to any changes in hormones. Women who are having menstrual cycles should track their cycle days. Women who have stopped having their menses should track other symptoms or changes, while women who are pregnant should track their pregnancy too. The use of hormonal medicines, such as contraceptives or birth control pills as well as hormonal replacement therapy, may affect seizures in some women, so record the dates and doses of these medicines.  NOTE: some seizure medicines may interfere with the effectiveness of  hormonal contraceptives making unexpected or unplanned pregnancy more likely. Be sure to talk to your doctor about all contraceptive use.When seizures cluster around menses or hormone changes, women should try to modify their lifestyle so other triggers don't occur during this high-risk time. Some women may use 'as needed' medicines to help treat seizures associated with menses. Note the use of these on your calendars and seizure preparedness plan.  Illness, fever, trauma: Notify your doctor if you become ill, have a fever, injure yourself seriously, or need other medicines such as antibiotics, painkillers, or cold medicines. Some people may notice that certain medicines can trigger seizures or interfere with seizure medicines. Fevers, other illnesses and injuries may also make you more susceptible and you'll need to monitor your seizures carefully. Try to limit other triggers during these times and talk to your doctor about what medicines you can use.  Stress, anxiety, depression: Emotional stress is a common trigger for some people, and stress can be a cause and symptom of mood problems such as anxiety and depression. Track your stress level and mood in relation to your seizures on your diary. During stressful times, consider ways to modify your lifestyle and manage stress better. . Try counseling to help cope with seizures or other problems. . Consider support groups for epilepsy, or  groups for stress management, therapy, and other support. . Write down feelings in a diary on a regular basis. It helps you get feelings out, rather than hold them in, and can help you see the issues more clearly. . Use 'time-out' periods. Just like kids may need a time-out when they are overwhelmed or acting out, so too do adults. Giving yourself a time-out allows you to take a step back from the stressor or situation and think about how best to address it. . Learn relaxation exercises, deep breathing, yoga,  or other strategies that help with stress and general well-being. . Tell your doctor and nurse how you feel. The effects of stress can be harmful to your seizures, and your life. When mood changes last longer than expected, you may need help from a mental health professional too. If you feel emotionally unsafe, call your doctor or go to an emergency room to be evaluated.

## 2014-08-14 LAB — COMPREHENSIVE METABOLIC PANEL
ALT: 7 IU/L (ref 0–32)
AST: 9 IU/L (ref 0–40)
Albumin/Globulin Ratio: 1.1 (ref 1.1–2.5)
Albumin: 3.8 g/dL (ref 3.5–5.5)
Alkaline Phosphatase: 56 IU/L (ref 39–117)
BUN/Creatinine Ratio: 20 (ref 8–20)
BUN: 16 mg/dL (ref 6–20)
CO2: 23 mmol/L (ref 18–29)
Calcium: 9.1 mg/dL (ref 8.7–10.2)
Chloride: 102 mmol/L (ref 97–108)
Creatinine, Ser: 0.79 mg/dL (ref 0.57–1.00)
GFR calc Af Amer: 114 mL/min/{1.73_m2} (ref >59)
GFR calc non Af Amer: 99 mL/min/{1.73_m2} (ref >59)
Globulin, Total: 3.5 g/dL (ref 1.5–4.5)
Glucose: 94 mg/dL (ref 65–99)
Potassium: 4.6 mmol/L (ref 3.5–5.2)
Sodium: 138 mmol/L (ref 134–144)
Total Bilirubin: 0.2 mg/dL (ref 0.0–1.2)
Total Protein: 7.3 g/dL (ref 6.0–8.5)

## 2014-08-14 LAB — CBC WITH DIFFERENTIAL
Basophils Absolute: 0 10*3/uL (ref 0.0–0.2)
Basos: 0 %
Eos: 3 %
Eosinophils Absolute: 0.2 10*3/uL (ref 0.0–0.4)
HCT: 34.9 % (ref 34.0–46.6)
Hemoglobin: 12 g/dL (ref 11.1–15.9)
Immature Grans (Abs): 0 10*3/uL (ref 0.0–0.1)
Immature Granulocytes: 0 %
Lymphocytes Absolute: 2.9 10*3/uL (ref 0.7–3.1)
Lymphs: 44 %
MCH: 30.8 pg (ref 26.6–33.0)
MCHC: 34.4 g/dL (ref 31.5–35.7)
MCV: 90 fL (ref 79–97)
Monocytes Absolute: 0.5 10*3/uL (ref 0.1–0.9)
Monocytes: 8 %
Neutrophils Absolute: 3 10*3/uL (ref 1.4–7.0)
Neutrophils Relative %: 45 %
Platelets: 261 10*3/uL (ref 150–379)
RBC: 3.89 x10E6/uL (ref 3.77–5.28)
RDW: 14 % (ref 12.3–15.4)
WBC: 6.6 10*3/uL (ref 3.4–10.8)

## 2014-08-14 LAB — VALPROIC ACID LEVEL: Valproic Acid Lvl: 80 ug/mL (ref 50–100)

## 2014-08-14 LAB — LEVETIRACETAM LEVEL: Levetiracetam Lvl: 14.4 ug/mL (ref 10.0–40.0)

## 2014-08-14 LAB — AMMONIA: Ammonia: 134 ug/dL (ref 19–87)

## 2014-08-14 NOTE — Progress Notes (Signed)
I agree with the above plan 

## 2014-12-19 ENCOUNTER — Telehealth: Payer: Self-pay

## 2014-12-19 NOTE — Telephone Encounter (Signed)
I called back.  Patient indicated she has chosen to increase her Clonazepam dose on her own from 2mg  twice daily to 2mg  three times daily.  (Per note from 08/05, she previously increased the dose on this same med on her own from one twice daily to one four times daily.  At that time she was advised she should take meds twice daily as prescribed, and Depakote dose was increased).  She claims no one told her that she needed to take meds as prescribed, and she decided to increase the dose due to seizures.  She is requesting a new Rx reflecting dose of one three times daily.  Has an appt scheduled on 04/28.  Please advise.  Thank you.

## 2014-12-19 NOTE — Telephone Encounter (Signed)
Patient called and she states she has increased her clonazepam 2 mg RX was for two times daily she increased to three times daily on her own.  Patient states by her increasing she only has one seizure a month.  Patient is now calling wondering about her refills because she will run out before time. Patient also requesting a refill for her levetiracteam  750. Mg two times daily

## 2014-12-20 NOTE — Telephone Encounter (Signed)
Needs sooner appointment with me or nurse practitioner whichever is earlier. Do not increase without a visit

## 2014-12-20 NOTE — Telephone Encounter (Signed)
I called patient back on home line, got no answer.  Mailbox was full, unable to leave message.  Called cell, got no answer.  Left message relaying Dr Marlis EdelsonSethi's note.

## 2014-12-21 NOTE — Telephone Encounter (Signed)
Called patient and left message for patient to call back

## 2014-12-26 ENCOUNTER — Telehealth: Payer: Self-pay | Admitting: Neurology

## 2014-12-26 NOTE — Telephone Encounter (Signed)
Per previous encounter, Dr Pearlean BrownieSethi does not wish to increase dose without an OV.  I called back.  Relayed this info to patient.  She verbalized understanding and said she will call back to schedule an earlier appt.

## 2014-12-26 NOTE — Telephone Encounter (Addendum)
Patient is calling about Rx Clonazepam 2 mg.  She is taking 3 pills per day instead of 2 and is about to run out.  Please call as patient is getting ready to go  Into knee surgery.  Patient states she is sorry about upping dosage and didn't realize this would be problem.  Please call at new cell phone number provided.

## 2014-12-26 NOTE — Telephone Encounter (Signed)
Stacy Pratt, this patient is calling stating that she needed an OV in order to get refill. She is already scheduled for April. Do you know if she needs to be seen sooner or not? Please advise

## 2014-12-28 NOTE — Telephone Encounter (Signed)
Tried to call patient, her mail box was full and could not leave a message, patient will need to be seen sooner by Dr Pearlean BrownieSethi or NP

## 2015-01-03 ENCOUNTER — Telehealth: Payer: Self-pay | Admitting: Neurology

## 2015-01-03 NOTE — Telephone Encounter (Signed)
Christina [facility] @ [phone], requesting surgery clearance for left knee surgery scheduled 01/22/15.  Please call and advise.

## 2015-01-03 NOTE — Telephone Encounter (Signed)
Stacy Pratt with [facility] requesting clearance for knee surgery, please advise.

## 2015-01-04 NOTE — Telephone Encounter (Signed)
It appears patient has not had seizures for some time and is stable. She is to continue seizure medications and is neurologically cleared for the surgery

## 2015-01-05 NOTE — Telephone Encounter (Signed)
Called [facility] and relayed Dr Marlis EdelsonSethi's message, also faxed documentation for surgery clearance.

## 2015-01-09 ENCOUNTER — Encounter (HOSPITAL_BASED_OUTPATIENT_CLINIC_OR_DEPARTMENT_OTHER): Payer: Self-pay | Admitting: *Deleted

## 2015-01-09 ENCOUNTER — Emergency Department (HOSPITAL_BASED_OUTPATIENT_CLINIC_OR_DEPARTMENT_OTHER)
Admission: EM | Admit: 2015-01-09 | Discharge: 2015-01-09 | Disposition: A | Discharge status: Home / Self Care | Payer: Medicaid Other | Attending: Emergency Medicine | Admitting: Emergency Medicine

## 2015-01-09 DIAGNOSIS — G40909 Epilepsy, unspecified, not intractable, without status epilepticus: Secondary | ICD-10-CM | POA: Insufficient documentation

## 2015-01-09 DIAGNOSIS — Z791 Long term (current) use of non-steroidal anti-inflammatories (NSAID): Secondary | ICD-10-CM | POA: Insufficient documentation

## 2015-01-09 DIAGNOSIS — Z720 Tobacco use: Secondary | ICD-10-CM | POA: Insufficient documentation

## 2015-01-09 DIAGNOSIS — Z79899 Other long term (current) drug therapy: Secondary | ICD-10-CM | POA: Diagnosis not present

## 2015-01-09 DIAGNOSIS — M25562 Pain in left knee: Secondary | ICD-10-CM | POA: Diagnosis present

## 2015-01-09 DIAGNOSIS — G8929 Other chronic pain: Secondary | ICD-10-CM | POA: Diagnosis not present

## 2015-01-09 DIAGNOSIS — M1990 Unspecified osteoarthritis, unspecified site: Secondary | ICD-10-CM | POA: Diagnosis not present

## 2015-01-09 DIAGNOSIS — Z880 Allergy status to penicillin: Secondary | ICD-10-CM | POA: Diagnosis not present

## 2015-01-09 DIAGNOSIS — Z8614 Personal history of Methicillin resistant Staphylococcus aureus infection: Secondary | ICD-10-CM | POA: Diagnosis not present

## 2015-01-09 DIAGNOSIS — Z7982 Long term (current) use of aspirin: Secondary | ICD-10-CM | POA: Diagnosis not present

## 2015-01-09 MED ORDER — HYDROMORPHONE HCL 1 MG/ML IJ SOLN
1.0000 mg | Freq: Once | INTRAMUSCULAR | Status: AC
  Administered 2015-01-09: 1 mg via INTRAMUSCULAR
  Filled 2015-01-09: qty 1

## 2015-01-09 NOTE — Discharge Instructions (Signed)

## 2015-01-09 NOTE — ED Provider Notes (Signed)
CSN: [id number]     Arrival date & time 01/09/15  1635 History   First MD Initiated Contact with Patient 01/09/15 1914     Chief Complaint  Patient presents with  . Knee Pain     (Consider location/radiation/quality/duration/timing/severity/associated sxs/prior Treatment) HPI Stacy Pratt is a 34 year old female with past medical history of osteoarthritis, chronic knee pain who presents the ER complaining of exacerbation of her knee pain. Patient states she is scheduled to have a total knee arthroplasty in her left knee later on this month with a preop visit in 3 days. Patient states over the weekend she "overdid it" and exacerbated her knee pain. Patient states she is here today for pain control. Patient denies any new injury to her knee, or new pain. Patient states her pain is consistent with her chronic pain. Patient denies any numbness, weakness, tingling.  Past Medical History  Diagnosis Date  . Arthritis   . MRSA (methicillin resistant Staphylococcus aureus)   . Osteoarthritis   . Seizures     last seizure 11/16/2102   Past Surgical History  Procedure Laterality Date  . Mrsa    . Abdominal surgery    . Knee surgery     No family history on file. History  Substance Use Topics  . Smoking status: Current Every Day Smoker -- 0.50 packs/day    Types: Cigarettes  . Smokeless tobacco: Never Used  . Alcohol Use: No   OB History    No data available     Review of Systems  Musculoskeletal: Positive for arthralgias.  Neurological: Negative for weakness and numbness.      Allergies  Penicillins  Home Medications   Prior to Admission medications   Medication Sig Start Date End Date Taking? Authorizing Provider  acetaminophen (TYLENOL) 500 MG tablet Take 500 mg by mouth.    Historical Provider, MD  acyclovir (ZOVIRAX) 800 MG tablet Take one tablet five times per day for seven days. 09/13/12   Carleene Cooper, MD  ASPIRIN PO Take by mouth.    Historical Provider, MD   clonazePAM (KLONOPIN) 2 MG tablet Take 1 tablet (2 mg total) by mouth 2 (two) times daily. 08/10/14   Ronal Fear, NP  divalproex (DEPAKOTE) 500 MG DR tablet TAKE 2 TABLETS BY MOUTH TWICE DAILY 08/11/14   Ronal Fear, NP  DULoxetine (CYMBALTA) 60 MG capsule Take 180 mg by mouth daily.     Historical Provider, MD  Glucosamine Sulfate 500 MG TABS Take by mouth.    Historical Provider, MD  HYDROcodone-acetaminophen (NORCO) 10-325 MG per tablet Take 1 tablet by mouth every 6 (six) hours.    Historical Provider, MD  ibuprofen (ADVIL,MOTRIN) 200 MG tablet Take 800 mg by mouth 2 (two) times daily as needed. Patient used this medication for her body pain.    Historical Provider, MD  levETIRAcetam (KEPPRA) 750 MG tablet Take 1 tablet (750 mg total) by mouth 2 (two) times daily. 08/10/14   Ronal Fear, NP  naproxen (NAPROSYN) 250 MG tablet Take 250 mg by mouth.    Historical Provider, MD  QUEtiapine (SEROQUEL) 300 MG tablet Take 2 tablets (600 mg total) by mouth at bedtime. 08/10/14   Ronal Fear, NP   BP 110/71 mmHg  Pulse 75  Temp(Src) 97.8 F (36.6 C) (Oral)  Resp 16  Ht  (1.549 m)  Wt 198 lb (89.812 kg)  BMI 37.43 kg/m2  SpO2 100%  LMP 12/26/2014 Physical Exam  Constitutional: She appears well-developed  and well-nourished. No distress.  HENT:  Head: Normocephalic and atraumatic.  Eyes: Conjunctivae are normal. Right eye exhibits no discharge. Left eye exhibits no discharge. No scleral icterus.  Cardiovascular:  Peripheral pulses intact at injured extremity.   Pulmonary/Chest: Effort normal. No respiratory distress.  Musculoskeletal:  Left knee exam. Mild effusion noted. No erythema, warmth, ecchymosis, deformity. Exam limited due to patient's pain. No obvious anterior/posterior or medial/lateral instability. Distal sensation intact. DP pulse 2+. Capillary refill less than 2 seconds.  Neurological: She is alert.  No numbness distal to injury.    Skin: Skin is warm and dry. No rash noted. She  is not diaphoretic.  Nursing note and vitals reviewed.   ED Course  Procedures (including critical care time) Labs Review Labs Reviewed - No data to display  Imaging Review No results found.   EKG Interpretation None      MDM   Final diagnoses:  Chronic knee pain, left    Patient here with exacerbation of chronic knee pain. Patient neurovascularly intact without evidence of new injury. Patient denying any new pain or new symptoms. She states she has preop visit for a TKA later this week. Patient's pain treated in the ER, patient states her symptoms are better under control and her pain is at a tolerable level at this time. Patient stating she is ready to leave without her pain is under control. I discussed return precautions with patient, and strongly encouraged her to follow-up with her preop appointment for her pain. Patient was understanding and agreement of this plan. I encouraged patient call or return to the ER should she have a questions or concerns.  BP 110/71 mmHg  Pulse 75  Temp(Src) 97.8 F (36.6 C) (Oral)  Resp 16  Ht 5\' 1"  (1.549 m)  Wt 198 lb (89.812 kg)  BMI 37.43 kg/m2  SpO2 100%  LMP 12/26/2014  Signed,  Ladona MowJoe Trashaun Streight, PA-C 1:16 AM   Monte FantasiaJoseph W Hammond Obeirne, PA-C 01/10/15 [id number]  Gerhard Munchobert Lockwood, MD 01/10/15 [phone]

## 2015-01-09 NOTE — ED Notes (Signed)
Pt. Reports she is having L knee pain and [facility]  Pt. Did not contact her Dr.  She reports her pain is unbearable.  Pt. Has slurred speech in triage and repeats multiple times she needs a bed to lay in.  Pt. Reports she needs pain medicine multiple times in triage off and on during questions.  Pt. Interrupts RN multiple times asking for pain meds and a bed.  RN explained to Pt. It may be a while today for a bed and to be seen.

## 2015-01-14 ENCOUNTER — Telehealth: Payer: Self-pay | Admitting: Neurology

## 2015-01-14 NOTE — Telephone Encounter (Signed)
Patient paged the on call doctor today. Patient was prescribed 2mg  bid of Klonopin but has been taking it 3-4 times daily instead. Patient said that this is not how Dr. Pearlean BrownieSethi prescribed the medication and that she increased her medication on her own without proper medical advice. Patient is fully aware that she was not supposed to take more Klonopin than prescribed, but she did.  Now patient has run out of her Klonopin and is requesting early prescriptions. Told her that I could not authorize an early prescription but would relay the message to Dr. Pearlean BrownieSethi.   Of note, patient was in the emergency room on 01/09/2015 asking for pain medications and a bed to lay in. Patient reported that she needs pain medication multiple times a day for severe knee pain. Nurse documented slurred speech.

## 2015-01-15 ENCOUNTER — Telehealth: Payer: Self-pay | Admitting: Neurology

## 2015-01-15 ENCOUNTER — Other Ambulatory Visit: Payer: Self-pay | Admitting: Nurse Practitioner

## 2015-01-15 NOTE — Telephone Encounter (Signed)
Dr Sethi, please see note below 

## 2015-01-15 NOTE — Telephone Encounter (Signed)
Pt is calling stating she needs a refill on clonazePAM (KLONOPIN) 2 MG tablet.  She is completely out.  She uses [facility] on [address] [place].  Please call and advise.

## 2015-01-15 NOTE — Telephone Encounter (Signed)
Please see previous note from on call doctor yesterday, she forwarded her message to Dr Pearlean BrownieSethi as well.   I called the pharmacy who said the patient last picked up #60 on 02/01.  I called the patient back.  She claims she did not know she has to take meds as prescribed although we have advised her of this multiple times.  She wants to know when it can be refilled again.  Explained the Rx is written for a 30 day supply each fill.  Patient verbalized understanding and says nothing further is needed at this time.

## 2015-01-15 NOTE — Telephone Encounter (Signed)
I called patient listed November but got no reply. I left a message on the answering machine counseling the patient to be compliant with the prescribed schedule of Klonopin and to take it only 2 mg twice daily. I will not encourage early refill. She was also advised to see her pain doctor for her chronic knee pain.

## 2015-01-24 ENCOUNTER — Other Ambulatory Visit: Payer: Self-pay | Admitting: Neurology

## 2015-01-24 MED ORDER — CLONAZEPAM 2 MG PO TABS: 2.0000 mg | ORAL_TABLET | Freq: Two times a day (BID) | ORAL | Status: DC

## 2015-01-24 NOTE — Telephone Encounter (Signed)
Rx was approved by Dr Pearlean BrownieSethi, and has been sent to the pharmacy.

## 2015-01-24 NOTE — Telephone Encounter (Signed)
On call page, patient needs refill of her medications.

## 2015-01-24 NOTE — Telephone Encounter (Signed)
I called the pharmacy.  Patient is requesting a refill on Clonazepam.  Patient has had this Rx filled for #60 (30 day supply) on 09/20, 10/15, 11/13, 12/09, 01/05 and 02/01.  No refills remain on previous Rx.  Patient has appt on 04/28.

## 2015-01-24 NOTE — Telephone Encounter (Signed)
Refill x 2 to last till next visit

## 2015-02-09 ENCOUNTER — Emergency Department (HOSPITAL_BASED_OUTPATIENT_CLINIC_OR_DEPARTMENT_OTHER)
Admission: EM | Admit: 2015-02-09 | Discharge: 2015-02-09 | Disposition: A | Discharge status: Home / Self Care | Payer: Medicaid Other | Attending: Emergency Medicine | Admitting: Emergency Medicine

## 2015-02-09 ENCOUNTER — Encounter (HOSPITAL_BASED_OUTPATIENT_CLINIC_OR_DEPARTMENT_OTHER): Payer: Self-pay

## 2015-02-09 DIAGNOSIS — R52 Pain, unspecified: Secondary | ICD-10-CM | POA: Diagnosis present

## 2015-02-09 DIAGNOSIS — Z7982 Long term (current) use of aspirin: Secondary | ICD-10-CM | POA: Diagnosis not present

## 2015-02-09 DIAGNOSIS — Z720 Tobacco use: Secondary | ICD-10-CM | POA: Diagnosis not present

## 2015-02-09 DIAGNOSIS — M1990 Unspecified osteoarthritis, unspecified site: Secondary | ICD-10-CM | POA: Diagnosis not present

## 2015-02-09 DIAGNOSIS — Z8614 Personal history of Methicillin resistant Staphylococcus aureus infection: Secondary | ICD-10-CM | POA: Diagnosis not present

## 2015-02-09 DIAGNOSIS — G40909 Epilepsy, unspecified, not intractable, without status epilepticus: Secondary | ICD-10-CM | POA: Diagnosis not present

## 2015-02-09 DIAGNOSIS — Z880 Allergy status to penicillin: Secondary | ICD-10-CM | POA: Diagnosis not present

## 2015-02-09 DIAGNOSIS — Z79899 Other long term (current) drug therapy: Secondary | ICD-10-CM | POA: Diagnosis not present

## 2015-02-09 MED ORDER — HYDROMORPHONE HCL 1 MG/ML IJ SOLN
2.0000 mg | Freq: Once | INTRAMUSCULAR | Status: AC
  Administered 2015-02-09: 2 mg via INTRAMUSCULAR
  Filled 2015-02-09: qty 2

## 2015-02-09 MED ORDER — KETOROLAC TROMETHAMINE 60 MG/2ML IM SOLN
60.0000 mg | Freq: Once | INTRAMUSCULAR | Status: AC
  Administered 2015-02-09: 60 mg via INTRAMUSCULAR
  Filled 2015-02-09: qty 2

## 2015-02-09 NOTE — Discharge Instructions (Signed)
Chronic Pain Discharge Instructions  °Emergency care providers appreciate that many patients coming to us are in severe pain and we wish to address their pain in the safest, most responsible manner.  It is important to recognize however, that the proper treatment of chronic pain differs from that of the pain of injuries and acute illnesses.  Our goal is to provide quality, safe, personalized care and we thank you for giving us the opportunity to serve you. °The use of narcotics and related agents for chronic pain syndromes may lead to additional physical and psychological problems.  Nearly as many people die from prescription narcotics each year as die from car crashes.  Additionally, this risk is increased if such prescriptions are obtained from a variety of sources.  Therefore, only your primary care physician or a pain management specialist is able to safely treat such syndromes with narcotic medications long-term.   ° °Documentation revealing such prescriptions have been sought from multiple sources may prohibit us from providing a refill or different narcotic medication.  Your name may be checked first through the [place] Controlled Substances Reporting System.  This database is a record of controlled substance medication prescriptions that the patient has received.  This has been established by [place] in an effort to eliminate the dangerous, and often life threatening, practice of obtaining multiple prescriptions from different medical providers.  ° °If you have a chronic pain syndrome (i.e. chronic headaches, recurrent back or neck pain, dental pain, abdominal or pelvis pain without a specific diagnosis, or neuropathic pain such as fibromyalgia) or recurrent visits for the same condition without an acute diagnosis, you may be treated with non-narcotics and other non-addictive medicines.  Allergic reactions or negative side effects that may be reported by a patient to such medications will not  typically lead to the use of a narcotic analgesic or other controlled substance as an alternative. °  °Patients managing chronic pain with a personal physician should have provisions in place for breakthrough pain.  If you are in crisis, you should call your physician.  If your physician directs you to the emergency department, please have the doctor call and speak to our attending physician concerning your care. °  °When patients come to the Emergency Department (ED) with acute medical conditions in which the Emergency Department physician feels appropriate to prescribe narcotic or sedating pain medication, the physician will prescribe these in very limited quantities.  The amount of these medications will last only until you can see your primary care physician in his/her office.  Any patient who returns to the ED seeking refills should expect only non-narcotic pain medications.  ° °In the event of an acute medical condition exists and the emergency physician feels it is necessary that the patient be given a narcotic or sedating medication -  a responsible adult driver should be present in the room prior to the medication being given by the nurse. °  °Prescriptions for narcotic or sedating medications that have been lost, stolen or expired will not be refilled in the Emergency Department.   ° °Patients who have chronic pain may receive non-narcotic prescriptions until seen by their primary care physician.  It is every patient’s personal responsibility to maintain active prescriptions with his or her primary care physician or specialist. °

## 2015-02-09 NOTE — ED Provider Notes (Signed)
CSN: [id number]     Arrival date & time 02/09/15  1249 History   First MD Initiated Contact with Patient 02/09/15 1346     Chief Complaint  Patient presents with  . Pain     (Consider location/radiation/quality/duration/timing/severity/associated sxs/prior Treatment) HPI   Stacy Pratt is a(n) 34 y.o. female who presents to the Ed with cc of generalized pain. She is status post left total knee replacement about 3 weeks ago. 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 She is a current daily smoker. I have reviewed the patient's pain medication on the drug database, and she appears to be taking her medications appropriately. She is currently taking Percocet 08/26/2024, but states that they are not controlling her pain. She states that her physical therapist was at the house today and advised her to come in for pain management because he was unable to work with her. The patient denies heat, redness, or other signs of infection in her knee. She denies chills, fevers, myalgias, or other signs of systemic infection. Denies DOE, SOB, chest tightness or pressure, radiation to left arm, jaw or back, or diaphoresis. Denies dysuria, flank pain, suprapubic pain, frequency, urgency, or hematuria. Denies headaches, light headedness, weakness, visual disturbances. Denies abdominal pain, nausea, vomiting, diarrhea or constipation.     Past Medical History  Diagnosis Date  . Arthritis   . MRSA (methicillin resistant Staphylococcus aureus)   . Osteoarthritis   . Seizures     last seizure 11/16/2102   Past Surgical History  Procedure Laterality Date  . Mrsa    . Abdominal surgery    . Knee surgery     History reviewed. No pertinent family history. History  Substance Use Topics  . Smoking status: Current Every Day Smoker -- 0.50 packs/day    Types: Cigarettes  . Smokeless tobacco: Never Used  . Alcohol Use: No   OB History    No data available      Review of Systems  Ten systems reviewed and are negative for acute change, except as noted in the HPI.    Allergies  Penicillins  Home Medications   Prior to Admission medications   Medication Sig Start Date End Date Taking? Authorizing Provider  acetaminophen (TYLENOL) 500 MG tablet Take 500 mg by mouth.   Yes Historical Provider, MD  ASPIRIN PO Take by mouth.   Yes Historical Provider, MD  clonazePAM (KLONOPIN) 2 MG tablet Take 1 tablet (2 mg total) by mouth 2 (two) times daily. Must Last 30 Days 01/24/15  Yes Micki Riley, MD  divalproex (DEPAKOTE) 500 MG DR tablet TAKE 2 TABLETS BY MOUTH TWICE DAILY 08/11/14  Yes Ronal Fear, NP  HYDROcodone-acetaminophen (NORCO) 10-325 MG per tablet Take 1 tablet by mouth every 6 (six) hours.   Yes Historical Provider, MD  ibuprofen (ADVIL,MOTRIN) 200 MG tablet Take 800 mg by mouth 2 (two) times daily as needed. Patient used this medication for her body pain.   Yes Historical Provider, MD  levETIRAcetam (KEPPRA) 750 MG tablet Take 1 tablet (750 mg total) by mouth 2 (two) times daily. 08/10/14  Yes Ronal Fear, NP  naproxen (NAPROSYN) 250 MG tablet Take 250 mg by mouth.   Yes Historical Provider, MD  acyclovir (ZOVIRAX) 800 MG tablet Take one tablet five times per day for seven days. 09/13/12   Carleene Cooper, MD  DULoxetine (CYMBALTA) 60 MG capsule Take 180 mg by mouth daily.     Historical Provider, MD  Glucosamine Sulfate 500 MG TABS Take by mouth.    Historical Provider, MD  QUEtiapine (SEROQUEL) 300 MG tablet Take 2 tablets (600 mg total) by mouth at bedtime. 08/10/14   Ronal FearLynn E Lam, NP   BP 107/91 mmHg  Pulse 97  Temp(Src) 97.7 F (36.5 C) (Oral)  Resp 18  Ht 5\' 1"  (1.549 m)  Wt 196 lb (88.905 kg)  BMI 37.05 kg/m2  SpO2 99%  LMP 02/02/2015 Physical Exam Physical Exam  Nursing note and vitals reviewed. Constitutional:She appears well-developed and well-nourished. She is sobbing during the examination. She appears older than stated  age. HENT:  Head: Normocephalic and atraumatic.  Eyes: Conjunctivae normal and EOM are normal. Pupils are equal, round, and reactive to light. No scleral icterus.  Neck: Normal range of motion.  Cardiovascular: Normal rate, regular rhythm and normal heart sounds.  Exam reveals no gallop and no friction rub.   No murmur heard. Pulmonary/Chest: Effort normal and breath sounds normal. No respiratory distress.  Abdominal: Soft. Bowel sounds are normal. She exhibits no distension and no mass. There is no tenderness. There is no guarding.  Musculoskeletal: Left knee with well healing incision site. Steri-Strips are placed. Range of motion is limited due to swelling. However, patient is able to move the knee. There are no signs of infection.  Neurological: She is alert and oriented to person, place, and time.  Skin: Skin is warm and dry. She is not diaphoretic.    ED Course  Procedures (including critical care time) Labs Review Labs Reviewed - No data to display  Imaging Review No results found.   EKG Interpretation None      MDM   Final diagnoses:  Generalized pain    Here for severe generalized pain. I called Dr. Rolly SalterLennon's office at [facility]. They advised not to change the medications or right for any prescription narcotics. I've spoken with the patient about their requests. I offered to treat her pain here. Patient appears safe for discharge at this time without further workup. She is advised to call first thing Monday morning to follow up with her orthopedist.    Arthor CaptainAbigail Yitta Gongaware, PA-C 02/14/15 [phone]  Benjiman CoreNathan Pickering, MD 02/16/15 1450

## 2015-02-09 NOTE — ED Notes (Signed)
Pt reports generalized body pain for 2 days - states her left knee hurts - s/p replacement 2/29 - also c/o bilateral elbow pain, due for replacement. States she is taking Hydrocodone 10/325 that is ineffective.

## 2015-02-09 NOTE — ED Notes (Signed)
Pt states pain is relieved and she is ready to go home. Fiance at bedside. PA will be notified.

## 2015-02-09 NOTE — ED Notes (Signed)
Pt very tearful. 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 Pt states she is taking hydrocodone 10-325 alternating with aleve. No relief. Pain 10/10

## 2015-02-16 ENCOUNTER — Emergency Department (HOSPITAL_BASED_OUTPATIENT_CLINIC_OR_DEPARTMENT_OTHER)
Admission: EM | Admit: 2015-02-16 | Discharge: 2015-02-16 | Disposition: A | Discharge status: Home / Self Care | Payer: Medicaid Other | Attending: Emergency Medicine | Admitting: Emergency Medicine

## 2015-02-16 ENCOUNTER — Encounter (HOSPITAL_BASED_OUTPATIENT_CLINIC_OR_DEPARTMENT_OTHER): Payer: Self-pay | Admitting: *Deleted

## 2015-02-16 DIAGNOSIS — Z720 Tobacco use: Secondary | ICD-10-CM | POA: Insufficient documentation

## 2015-02-16 DIAGNOSIS — Z7982 Long term (current) use of aspirin: Secondary | ICD-10-CM | POA: Diagnosis not present

## 2015-02-16 DIAGNOSIS — M1990 Unspecified osteoarthritis, unspecified site: Secondary | ICD-10-CM | POA: Diagnosis not present

## 2015-02-16 DIAGNOSIS — G40909 Epilepsy, unspecified, not intractable, without status epilepticus: Secondary | ICD-10-CM | POA: Diagnosis not present

## 2015-02-16 DIAGNOSIS — Z8614 Personal history of Methicillin resistant Staphylococcus aureus infection: Secondary | ICD-10-CM | POA: Insufficient documentation

## 2015-02-16 DIAGNOSIS — Z79899 Other long term (current) drug therapy: Secondary | ICD-10-CM | POA: Insufficient documentation

## 2015-02-16 DIAGNOSIS — M2550 Pain in unspecified joint: Secondary | ICD-10-CM

## 2015-02-16 DIAGNOSIS — Z880 Allergy status to penicillin: Secondary | ICD-10-CM | POA: Diagnosis not present

## 2015-02-16 DIAGNOSIS — Z791 Long term (current) use of non-steroidal anti-inflammatories (NSAID): Secondary | ICD-10-CM | POA: Insufficient documentation

## 2015-02-16 DIAGNOSIS — Z96653 Presence of artificial knee joint, bilateral: Secondary | ICD-10-CM | POA: Diagnosis not present

## 2015-02-16 MED ORDER — HYDROMORPHONE HCL 1 MG/ML IJ SOLN
2.0000 mg | Freq: Once | INTRAMUSCULAR | Status: AC
  Administered 2015-02-16: 2 mg via INTRAMUSCULAR
  Filled 2015-02-16: qty 2

## 2015-02-16 NOTE — ED Provider Notes (Addendum)
CSN: [id number]     Arrival date & time 02/16/15  1201 History   First MD Initiated Contact with Patient 02/16/15 1344     Chief Complaint  Patient presents with  . Joint Pain     (Consider location/radiation/quality/duration/timing/severity/associated sxs/prior Treatment) HPI Comments: Patient with a history of osteoarthritis presents with multiple joint pains. She has a long-standing history of arthritis with joint pains. She is seen a rheumatologist who thinks that she may have rheumatoid arthritis and she's recently had pacemaker in February. She is on chronic opioid medications which currently is hydrocodone 7.5 mg tablets which is currently prescribed by her orthopedist at [facility]. She states that she cannot get her new prescription filled until tomorrow. She's been referred to pain management but has not had her first appointment yet. She complains of her typical joint pains which seems to be worse over the last few days. She denies any joint swelling or fevers. She denies any recent injuries.   Past Medical History  Diagnosis Date  . Arthritis   . MRSA (methicillin resistant Staphylococcus aureus)   . Osteoarthritis   . Seizures     last seizure Jan 2016   Past Surgical History  Procedure Laterality Date  . Mrsa    . Abdominal surgery    . Knee surgery    . Replacement total knee bilateral     No family history on file. History  Substance Use Topics  . Smoking status: Current Every Day Smoker -- 0.50 packs/day    Types: Cigarettes  . Smokeless tobacco: Never Used  . Alcohol Use: No   OB History    No data available     Review of Systems  Constitutional: Negative for fever, chills, diaphoresis and fatigue.  HENT: Negative for congestion, rhinorrhea and sneezing.   Eyes: Negative.   Respiratory: Negative for cough, chest tightness and shortness of breath.   Cardiovascular: Negative for chest pain and leg swelling.  Gastrointestinal: Negative for  nausea, vomiting, abdominal pain, diarrhea and blood in stool.  Genitourinary: Negative for frequency, hematuria, flank pain and difficulty urinating.  Musculoskeletal: Positive for arthralgias. Negative for back pain and joint swelling.  Skin: Negative for rash.  Neurological: Negative for dizziness, speech difficulty, weakness, numbness and headaches.      Allergies  Penicillins  Home Medications   Prior to Admission medications   Medication Sig Start Date End Date Taking? Authorizing Provider  ASPIRIN PO Take by mouth.   Yes Historical Provider, MD  clonazePAM (KLONOPIN) 2 MG tablet Take 1 tablet (2 mg total) by mouth 2 (two) times daily. Must Last 30 Days 01/24/15  Yes Micki RileyPramod S Sethi, MD  divalproex (DEPAKOTE) 500 MG DR tablet TAKE 2 TABLETS BY MOUTH TWICE DAILY 08/11/14  Yes Ronal FearLynn E Lam, NP  HYDROcodone-acetaminophen (NORCO) 7.5-325 MG per tablet Take 1 tablet by mouth every 6 (six) hours as needed for moderate pain.   Yes Historical Provider, MD  levETIRAcetam (KEPPRA) 750 MG tablet Take 1 tablet (750 mg total) by mouth 2 (two) times daily. 08/10/14  Yes Ronal FearLynn E Lam, NP  meloxicam (MOBIC) 15 MG tablet Take 15 mg by mouth daily.   Yes Historical Provider, MD  acetaminophen (TYLENOL) 500 MG tablet Take 500 mg by mouth.    Historical Provider, MD  acyclovir (ZOVIRAX) 800 MG tablet Take one tablet five times per day for seven days. 09/13/12   Carleene CooperAlan Davidson, MD  DULoxetine (CYMBALTA) 60 MG capsule Take 180 mg by mouth daily.  Historical Provider, MD  Glucosamine Sulfate 500 MG TABS Take by mouth.    Historical Provider, MD  HYDROcodone-acetaminophen (NORCO) 10-325 MG per tablet Take 1 tablet by mouth every 6 (six) hours.    Historical Provider, MD  ibuprofen (ADVIL,MOTRIN) 200 MG tablet Take 800 mg by mouth 2 (two) times daily as needed. Patient used this medication for her body pain.    Historical Provider, MD  naproxen (NAPROSYN) 250 MG tablet Take 250 mg by mouth.    Historical Provider,  MD  QUEtiapine (SEROQUEL) 300 MG tablet Take 2 tablets (600 mg total) by mouth at bedtime. 08/10/14   Ronal Fear, NP   BP 115/67 mmHg  Pulse 89  Temp(Src) 97.8 F (36.6 C) (Oral)  Resp 20  Ht  (1.549 m)  Wt 192 lb (87.091 kg)  BMI 36.30 kg/m2  SpO2 99%  LMP 02/02/2015 Physical Exam  Constitutional: She is oriented to person, place, and time. She appears well-developed and well-nourished.  HENT:  Head: Normocephalic and atraumatic.  Eyes: Pupils are equal, round, and reactive to light.  Neck: Normal range of motion. Neck supple.  Cardiovascular: Normal rate, regular rhythm and normal heart sounds.   Pulmonary/Chest: Effort normal and breath sounds normal. No respiratory distress. She has no wheezes. She has no rales. She exhibits no tenderness.  Abdominal: Soft. Bowel sounds are normal. There is no tenderness. There is no rebound and no guarding.  Musculoskeletal: Normal range of motion. She exhibits no edema.  She has pain in multiple joints but no visible joint swelling or redness other than she has some mild swelling in her left knee where she had recent surgery. There is no redness drainage or signs of infection  Lymphadenopathy:    She has no cervical adenopathy.  Neurological: She is alert and oriented to person, place, and time.  Skin: Skin is warm and dry. No rash noted.  Psychiatric: She has a normal mood and affect.    ED Course  Procedures (including critical care time) Labs Review Labs Reviewed - No data to display  Imaging Review No results found.   EKG Interpretation None      MDM   Final diagnoses:  Polyarthralgia    Patient is given a shot of Dilaudid here in the ED but I advised her that we don't do chronic pain management in the ED. I advised her we cannot give her any prescription medications. I also advised her that we cannot continue to give her injections and manage her chronic pain in the ED.    Rolan Bucco, MD 02/16/15  1413  Rolan Bucco, MD 02/16/15 [phone]

## 2015-02-16 NOTE — ED Notes (Signed)
Reports history of joint pain "for years"- states pain worse since last night- taking hydrocodone without relief prescribed by ortho doctor

## 2015-02-16 NOTE — ED Notes (Signed)
Pt ride is now at bedside. Pt medicated as ordered.

## 2015-02-16 NOTE — ED Notes (Signed)
Pt states "my ride will pick me up a little bit later..." informed pt that I cannot give her this shot until I know for certain that she has a ride home. Pt states "I'll call him and tell him to hurry" pt states she will call this rn when her ride gets here.

## 2015-02-16 NOTE — Discharge Instructions (Signed)

## 2015-03-17 ENCOUNTER — Emergency Department (HOSPITAL_BASED_OUTPATIENT_CLINIC_OR_DEPARTMENT_OTHER)
Admission: EM | Admit: 2015-03-17 | Discharge: 2015-03-17 | Disposition: A | Discharge status: Home / Self Care | Payer: Medicaid Other | Attending: Emergency Medicine | Admitting: Emergency Medicine

## 2015-03-17 ENCOUNTER — Encounter (HOSPITAL_BASED_OUTPATIENT_CLINIC_OR_DEPARTMENT_OTHER): Payer: Self-pay

## 2015-03-17 DIAGNOSIS — G40909 Epilepsy, unspecified, not intractable, without status epilepticus: Secondary | ICD-10-CM | POA: Insufficient documentation

## 2015-03-17 DIAGNOSIS — Z3202 Encounter for pregnancy test, result negative: Secondary | ICD-10-CM | POA: Insufficient documentation

## 2015-03-17 DIAGNOSIS — Z880 Allergy status to penicillin: Secondary | ICD-10-CM | POA: Diagnosis not present

## 2015-03-17 DIAGNOSIS — W01198A Fall on same level from slipping, tripping and stumbling with subsequent striking against other object, initial encounter: Secondary | ICD-10-CM | POA: Diagnosis not present

## 2015-03-17 DIAGNOSIS — Z8614 Personal history of Methicillin resistant Staphylococcus aureus infection: Secondary | ICD-10-CM | POA: Diagnosis not present

## 2015-03-17 DIAGNOSIS — Y9289 Other specified places as the place of occurrence of the external cause: Secondary | ICD-10-CM | POA: Diagnosis not present

## 2015-03-17 DIAGNOSIS — Y9389 Activity, other specified: Secondary | ICD-10-CM | POA: Insufficient documentation

## 2015-03-17 DIAGNOSIS — M25562 Pain in left knee: Secondary | ICD-10-CM

## 2015-03-17 DIAGNOSIS — M1990 Unspecified osteoarthritis, unspecified site: Secondary | ICD-10-CM | POA: Insufficient documentation

## 2015-03-17 DIAGNOSIS — Z720 Tobacco use: Secondary | ICD-10-CM | POA: Insufficient documentation

## 2015-03-17 DIAGNOSIS — Y998 Other external cause status: Secondary | ICD-10-CM | POA: Insufficient documentation

## 2015-03-17 DIAGNOSIS — S8992XA Unspecified injury of left lower leg, initial encounter: Secondary | ICD-10-CM | POA: Insufficient documentation

## 2015-03-17 LAB — PREGNANCY, URINE: PREG TEST UR: NEGATIVE

## 2015-03-17 LAB — URINALYSIS, ROUTINE W REFLEX MICROSCOPIC
BILIRUBIN URINE: NEGATIVE
Glucose, UA: NEGATIVE mg/dL
HGB URINE DIPSTICK: NEGATIVE
Ketones, ur: NEGATIVE mg/dL
NITRITE: NEGATIVE
PH: 5.5 (ref 5.0–8.0)
Protein, ur: NEGATIVE mg/dL
SPECIFIC GRAVITY, URINE: 1.029 (ref 1.005–1.030)
UROBILINOGEN UA: 1 mg/dL (ref 0.0–1.0)

## 2015-03-17 LAB — URINE MICROSCOPIC-ADD ON

## 2015-03-17 MED ORDER — HYDROCODONE-ACETAMINOPHEN 5-325 MG PO TABS
1.0000 | ORAL_TABLET | Freq: Once | ORAL | Status: AC
  Administered 2015-03-17: 1 via ORAL
  Filled 2015-03-17: qty 1

## 2015-03-17 MED ORDER — HYDROCODONE-ACETAMINOPHEN 5-325 MG PO TABS
1.0000 | ORAL_TABLET | Freq: Four times a day (QID) | ORAL | Status: DC | PRN
Start: 2015-03-17 — End: 2015-09-06

## 2015-03-17 NOTE — ED Notes (Signed)
Pt reports L knee pain secondary to knee replacement mid February of this year.  Ambulatory, appears drowsy in triage.  Reports multiple joint pains, legs, knees, ankles, elbows.

## 2015-03-17 NOTE — ED Notes (Signed)
Patient C/O pain in her left knee.  Patient had a left TKR Jan 22, 2015.  Patient states that the pain has become intolerable.  States that she had a seizure and fell striking her knees and face. Left knee noted to be swollen.

## 2015-03-17 NOTE — ED Provider Notes (Signed)
CSN: [id number]     Arrival date & time 03/17/15  1629 History  This chart was scribed for Gerhard Munchobert Kaleab Frasier, MD by [place] Peace, ED Scribe. The patient was seen in [facility]. The patient's care was started at 6:18 PM.    Chief Complaint  Patient presents with  . Knee Pain      Patient is a 34 y.o. female presenting with knee pain. The history is provided by the patient. No language interpreter was used.  Knee Pain Location:  Knee Knee location:  L knee  HPI Comments: Stacy Pratt is a 34 y.o. female who presents to the Emergency Department complaining of severe left knee pain x 1 week.  Pt reports episode of seizure that occurred last week in which she fell and injured her knees and face on the ground. Pt states she has tried taking Tramadol, Ibuprofen, and Tylenol to address pain without relief. Past surgical history of left TKR 01/22/2015. History of osteoarthritis. She states her Orthopedic is Dr. Mosie EpsteinLenning whom she follows up with regularly regarding knee and ankle problems. Pt is current everyday smoker.    Past Medical History  Diagnosis Date  . Arthritis   . MRSA (methicillin resistant Staphylococcus aureus)   . Osteoarthritis   . Seizures     last seizure Jan 2016   Past Surgical History  Procedure Laterality Date  . Mrsa    . Abdominal surgery    . Knee surgery    . Replacement total knee bilateral     No family history on file. History  Substance Use Topics  . Smoking status: Current Every Day Smoker -- 0.50 packs/day    Types: Cigarettes  . Smokeless tobacco: Never Used  . Alcohol Use: No   OB History    No data available     Review of Systems  Constitutional:       Per HPI, otherwise negative  HENT:       Per HPI, otherwise negative  Respiratory:       Per HPI, otherwise negative  Cardiovascular:       Per HPI, otherwise negative  Gastrointestinal: Negative for vomiting.  Endocrine:       Negative aside from HPI  Genitourinary:       Neg aside from  HPI   Musculoskeletal: Positive for arthralgias (left knee pain).       Per HPI, otherwise negative  Skin: Negative.   Neurological: Negative for syncope.      Allergies  Penicillins  Home Medications   Prior to Admission medications   Medication Sig Start Date End Date Taking? Authorizing Provider  acetaminophen (TYLENOL) 500 MG tablet Take 500 mg by mouth.    Historical Provider, MD  acyclovir (ZOVIRAX) 800 MG tablet Take one tablet five times per day for seven days. 09/13/12   Carleene CooperAlan Davidson, MD  ASPIRIN PO Take by mouth.    Historical Provider, MD  clonazePAM (KLONOPIN) 2 MG tablet Take 1 tablet (2 mg total) by mouth 2 (two) times daily. Must Last 30 Days 01/24/15   Micki RileyPramod S Sethi, MD  divalproex (DEPAKOTE) 500 MG DR tablet TAKE 2 TABLETS BY MOUTH TWICE DAILY 08/11/14   Ronal FearLynn E Lam, NP  DULoxetine (CYMBALTA) 60 MG capsule Take 180 mg by mouth daily.     Historical Provider, MD  Glucosamine Sulfate 500 MG TABS Take by mouth.    Historical Provider, MD  HYDROcodone-acetaminophen (NORCO) 10-325 MG per tablet Take 1 tablet by mouth every 6 (six) hours.  Historical Provider, MD  HYDROcodone-acetaminophen (NORCO) 7.5-325 MG per tablet Take 1 tablet by mouth every 6 (six) hours as needed for moderate pain.    Historical Provider, MD  ibuprofen (ADVIL,MOTRIN) 200 MG tablet Take 800 mg by mouth 2 (two) times daily as needed. Patient used this medication for her body pain.    Historical Provider, MD  levETIRAcetam (KEPPRA) 750 MG tablet Take 1 tablet (750 mg total) by mouth 2 (two) times daily. 08/10/14   Ronal Fear, NP  meloxicam (MOBIC) 15 MG tablet Take 15 mg by mouth daily.    Historical Provider, MD  naproxen (NAPROSYN) 250 MG tablet Take 250 mg by mouth.    Historical Provider, MD  QUEtiapine (SEROQUEL) 300 MG tablet Take 2 tablets (600 mg total) by mouth at bedtime. 08/10/14   Ronal Fear, NP   BP 109/64 mmHg  Pulse 103  Temp(Src) 98.1 F (36.7 C) (Oral)  Resp 18  Ht  (1.549 m)   Wt 192 lb (87.091 kg)  BMI 36.30 kg/m2  SpO2 99%  LMP 03/10/2015 Physical Exam  Constitutional: She is oriented to person, place, and time. She appears well-developed and well-nourished. No distress.  HENT:  Head: Normocephalic and atraumatic.  Eyes: Conjunctivae and EOM are normal.  Cardiovascular: Normal rate, regular rhythm and intact distal pulses.   Pulmonary/Chest: Effort normal. No stridor. No respiratory distress.  Musculoskeletal: She exhibits no edema.  Limited ROM to left knee due to pain. Preserved strength in flexion and extension of hip and knee. Vertical oriented scars on both knees.   Neurological: She is alert and oriented to person, place, and time. No cranial nerve deficit. She exhibits normal muscle tone. Coordination normal.  Skin: Skin is warm and dry.  Psychiatric: She has a normal mood and affect.  Nursing note and vitals reviewed.   ED Course  Procedures (including critical care time) Labs Review Labs Reviewed  URINALYSIS, ROUTINE W REFLEX MICROSCOPIC - Abnormal; Notable for the following:    Color, Urine AMBER (*)    Leukocytes, UA TRACE (*)    All other components within normal limits  URINE MICROSCOPIC-ADD ON - Abnormal; Notable for the following:    Bacteria, UA FEW (*)    All other components within normal limits  PREGNANCY, URINE     6:21 PM- Treatment plan was discussed with patient who verbalizes understanding and agrees.   MDM   Final diagnoses:  Left knee pain  Patient with history of bilateral knee repair presents with new left knee pain following a fall about one week ago. Patient is ambulatory, in no distress, distally neurovascularly stable. Patient was counseled on the need for ice, rest, provided a short course of increased analgesia, struck in follow up promptly with her orthopedist for further ongoing management. Low suspicion for occult fracture, and no evidence for vascular or tendinous disruption.    I personally performed  the services described in this documentation, which was scribed in my presence. The recorded information has been reviewed and is accurate.      Gerhard Munch, MD 03/17/15 [phone]

## 2015-03-17 NOTE — Discharge Instructions (Signed)
As discussed, today's evaluation is largely reassuring, in terms of demonstrating preserved functionality of your knee.  However, with your ongoing pain, your history of surgical repair, is follow-up with orthopedist for further evaluation and management.  Return here for changes in your condition.  Arthralgia Your caregiver has diagnosed you as suffering from an arthralgia. Arthralgia means there is pain in a joint. This can come from many reasons including:  Bruising the joint which causes soreness (inflammation) in the joint.  Wear and tear on the joints which occur as we grow older (osteoarthritis).  Overusing the joint.  Various forms of arthritis.  Infections of the joint. Regardless of the cause of pain in your joint, most of these different pains respond to anti-inflammatory drugs and rest. The exception to this is when a joint is infected, and these cases are treated with antibiotics, if it is a bacterial infection. HOME CARE INSTRUCTIONS   Rest the injured area for as long as directed by your caregiver. Then slowly start using the joint as directed by your caregiver and as the pain allows. Crutches as directed may be useful if the ankles, knees or hips are involved. If the knee was splinted or casted, continue use and care as directed. If an stretchy or elastic wrapping bandage has been applied today, it should be removed and re-applied every 3 to 4 hours. It should not be applied tightly, but firmly enough to keep swelling down. Watch toes and feet for swelling, bluish discoloration, coldness, numbness or excessive pain. If any of these problems (symptoms) occur, remove the ace bandage and re-apply more loosely. If these symptoms persist, contact your caregiver or return to this location.  For the first 24 hours, keep the injured extremity elevated on pillows while lying down.  Apply ice for 15-20 minutes to the sore joint every couple hours while awake for the first half day. Then  03-04 times per day for the first 48 hours. Put the ice in a plastic bag and place a towel between the bag of ice and your skin.  Wear any splinting, casting, elastic bandage applications, or slings as instructed.  Only take over-the-counter or prescription medicines for pain, discomfort, or fever as directed by your caregiver. Do not use aspirin immediately after the injury unless instructed by your physician. Aspirin can cause increased bleeding and bruising of the tissues.  If you were given crutches, continue to use them as instructed and do not resume weight bearing on the sore joint until instructed. Persistent pain and inability to use the sore joint as directed for more than 2 to 3 days are warning signs indicating that you should see a caregiver for a follow-up visit as soon as possible. Initially, a hairline fracture (break in bone) may not be evident on X-rays. Persistent pain and swelling indicate that further evaluation, non-weight bearing or use of the joint (use of crutches or slings as instructed), or further X-rays are indicated. X-rays may sometimes not show a small fracture until a week or 10 days later. Make a follow-up appointment with your own caregiver or one to whom we have referred you. A radiologist (specialist in reading X-rays) may read your X-rays. Make sure you know how you are to obtain your X-ray results. Do not assume everything is normal if you do not hear from [place]. SEEK MEDICAL CARE IF: Bruising, swelling, or pain increases. SEEK IMMEDIATE MEDICAL CARE IF:   Your fingers or toes are numb or blue.  The pain is  not responding to medications and continues to stay the same or get worse.  The pain in your joint becomes severe.  You develop a fever over 102 F (38.9 C).  It becomes impossible to move or use the joint. MAKE SURE YOU:   Understand these instructions.  Will watch your condition.  Will get help right away if you are not doing well or get  worse. Document Released: 11/10/2005 Document Revised: 02/02/2012 Document Reviewed: 06/28/2008 [facility] Patient Information 2015 [place]. This information is not intended to replace advice given to you by your health care provider. Make sure you discuss any questions you have with your health care provider.  Cryotherapy Cryotherapy means treatment with cold. Ice or gel packs can be used to reduce both pain and swelling. Ice is the most helpful within the first 24 to 48 hours after an injury or flare-up from overusing a muscle or joint. Sprains, strains, spasms, burning pain, shooting pain, and aches can all be eased with ice. Ice can also be used when recovering from surgery. Ice is effective, has very few side effects, and is safe for most people to use. PRECAUTIONS  Ice is not a safe treatment option for people with:  Raynaud phenomenon. This is a condition affecting small blood vessels in the extremities. Exposure to cold may cause your problems to return.  Cold hypersensitivity. There are many forms of cold hypersensitivity, including:  Cold urticaria. Red, itchy hives appear on the skin when the tissues begin to warm after being iced.  Cold erythema. This is a red, itchy rash caused by exposure to cold.  Cold hemoglobinuria. Red blood cells break down when the tissues begin to warm after being iced. The hemoglobin that carry oxygen are passed into the urine because they cannot combine with blood proteins fast enough.  Numbness or altered sensitivity in the area being iced. If you have any of the following conditions, do not use ice until you have discussed cryotherapy with your caregiver:  Heart conditions, such as arrhythmia, angina, or chronic heart disease.  High blood pressure.  Healing wounds or open skin in the area being iced.  Current infections.  Rheumatoid arthritis.  Poor circulation.  Diabetes. Ice slows the blood flow in the region it is applied. This is  beneficial when trying to stop inflamed tissues from spreading irritating chemicals to surrounding tissues. However, if you expose your skin to cold temperatures for too long or without the proper protection, you can damage your skin or nerves. Watch for signs of skin damage due to cold. HOME CARE INSTRUCTIONS Follow these tips to use ice and cold packs safely.  Place a dry or damp towel between the ice and skin. A damp towel will cool the skin more quickly, so you may need to shorten the time that the ice is used.  For a more rapid response, add gentle compression to the ice.  Ice for no more than 10 to 20 minutes at a time. The bonier the area you are icing, the less time it will take to get the benefits of ice.  Check your skin after 5 minutes to make sure there are no signs of a poor response to cold or skin damage.  Rest 20 minutes or more between uses.  Once your skin is numb, you can end your treatment. You can test numbness by very lightly touching your skin. The touch should be so light that you do not see the skin dimple from the pressure  of your fingertip. When using ice, most people will feel these normal sensations in this order: cold, burning, aching, and numbness.  Do not use ice on someone who cannot communicate their responses to pain, such as small children or people with dementia. HOW TO MAKE AN ICE PACK Ice packs are the most common way to use ice therapy. Other methods include ice massage, ice baths, and cryosprays. Muscle creams that cause a cold, tingly feeling do not offer the same benefits that ice offers and should not be used as a substitute unless recommended by your caregiver. To make an ice pack, do one of the following:  Place crushed ice or a bag of frozen vegetables in a sealable plastic bag. Squeeze out the excess air. Place this bag inside another plastic bag. Slide the bag into a pillowcase or place a damp towel between your skin and the bag.  Mix 3 parts  water with 1 part rubbing alcohol. Freeze the mixture in a sealable plastic bag. When you remove the mixture from the freezer, it will be slushy. Squeeze out the excess air. Place this bag inside another plastic bag. Slide the bag into a pillowcase or place a damp towel between your skin and the bag. SEEK MEDICAL CARE IF:  You develop white spots on your skin. This may give the skin a blotchy (mottled) appearance.  Your skin turns blue or pale.  Your skin becomes waxy or hard.  Your swelling gets worse. MAKE SURE YOU:   Understand these instructions.  Will watch your condition.  Will get help right away if you are not doing well or get worse. Document Released: 07/07/2011 Document Revised: 03/27/2014 Document Reviewed: 07/07/2011 [facility] Patient Information 2015 [place], [place]. This information is not intended to replace advice given to you by your health care provider. Make sure you discuss any questions you have with your health care provider.

## 2015-03-22 ENCOUNTER — Encounter: Payer: Self-pay | Admitting: Neurology

## 2015-03-22 ENCOUNTER — Ambulatory Visit (INDEPENDENT_AMBULATORY_CARE_PROVIDER_SITE_OTHER): Payer: Medicaid Other | Admitting: Neurology

## 2015-03-22 ENCOUNTER — Ambulatory Visit: Payer: Medicaid Other | Admitting: Neurology

## 2015-03-22 DIAGNOSIS — G40311 Generalized idiopathic epilepsy and epileptic syndromes, intractable, with status epilepticus: Secondary | ICD-10-CM | POA: Diagnosis not present

## 2015-03-22 DIAGNOSIS — G40319 Generalized idiopathic epilepsy and epileptic syndromes, intractable, without status epilepticus: Secondary | ICD-10-CM

## 2015-03-22 MED ORDER — CLONAZEPAM 2 MG PO TABS: 2.0000 mg | ORAL_TABLET | Freq: Three times a day (TID) | ORAL | Status: DC

## 2015-03-22 NOTE — Patient Instructions (Signed)
I had a long discussion with the patient and her mother regarding her seizures and the need to be compliant with his seizure medications. Increase Klonopin to 2 mg one tablet 3 times daily and continue Depakote and Keppra in the current dosages. She was advised to avoid sleep deprivation. She was also advised to see her primary physician to help manage her insomnia. Return for follow-up in 6 months with Butch PennyMegan Millikan, NP or call earlier if necessary.
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
## 2015-03-23 LAB — VALPROIC ACID LEVEL: VALPROIC ACID LVL: 81 ug/mL (ref 50–100)

## 2015-03-26 ENCOUNTER — Telehealth: Payer: Self-pay | Admitting: *Deleted

## 2015-03-26 NOTE — Telephone Encounter (Signed)
-----   Message from Micki RileyPramod S Sethi, MD sent at 03/23/2015  4:53 PM EDT ----- Joneen RoachKindly inform patient that valproic acid level is normal

## 2015-03-26 NOTE — Telephone Encounter (Signed)
Per Dr Pearlean BrownieSethi, called patient and left detailed message re: her valproic acid level is normal. Left this caller's name ands number for questions.

## 2015-04-05 ENCOUNTER — Telehealth: Payer: Self-pay | Admitting: *Deleted

## 2015-04-05 ENCOUNTER — Telehealth: Payer: Self-pay | Admitting: Neurology

## 2015-04-05 NOTE — Telephone Encounter (Signed)
Patient will sign a release from  on Friday.

## 2015-04-05 NOTE — Telephone Encounter (Signed)
Patient called and stated that Stacy Pratt called and left her a message asking her to call back. Please call and advise.

## 2015-06-11 DIAGNOSIS — Z0289 Encounter for other administrative examinations: Secondary | ICD-10-CM

## 2015-06-25 ENCOUNTER — Emergency Department (HOSPITAL_BASED_OUTPATIENT_CLINIC_OR_DEPARTMENT_OTHER)
Admission: EM | Admit: 2015-06-25 | Discharge: 2015-06-25 | Disposition: A | Discharge status: Other Acute Inpatient Hospital | Payer: Medicaid Other | Attending: Emergency Medicine | Admitting: Emergency Medicine

## 2015-06-25 ENCOUNTER — Encounter (HOSPITAL_BASED_OUTPATIENT_CLINIC_OR_DEPARTMENT_OTHER): Payer: Self-pay | Admitting: *Deleted

## 2015-06-25 ENCOUNTER — Emergency Department (HOSPITAL_BASED_OUTPATIENT_CLINIC_OR_DEPARTMENT_OTHER): Payer: Medicaid Other

## 2015-06-25 DIAGNOSIS — Z79899 Other long term (current) drug therapy: Secondary | ICD-10-CM | POA: Insufficient documentation

## 2015-06-25 DIAGNOSIS — Z8614 Personal history of Methicillin resistant Staphylococcus aureus infection: Secondary | ICD-10-CM | POA: Insufficient documentation

## 2015-06-25 DIAGNOSIS — M25571 Pain in right ankle and joints of right foot: Secondary | ICD-10-CM | POA: Diagnosis present

## 2015-06-25 DIAGNOSIS — L03115 Cellulitis of right lower limb: Secondary | ICD-10-CM | POA: Diagnosis not present

## 2015-06-25 DIAGNOSIS — Z791 Long term (current) use of non-steroidal anti-inflammatories (NSAID): Secondary | ICD-10-CM | POA: Diagnosis not present

## 2015-06-25 DIAGNOSIS — Z880 Allergy status to penicillin: Secondary | ICD-10-CM | POA: Diagnosis not present

## 2015-06-25 DIAGNOSIS — Z720 Tobacco use: Secondary | ICD-10-CM | POA: Insufficient documentation

## 2015-06-25 DIAGNOSIS — G40909 Epilepsy, unspecified, not intractable, without status epilepticus: Secondary | ICD-10-CM | POA: Insufficient documentation

## 2015-06-25 DIAGNOSIS — M1990 Unspecified osteoarthritis, unspecified site: Secondary | ICD-10-CM | POA: Insufficient documentation

## 2015-06-25 LAB — CBC WITH DIFFERENTIAL/PLATELET
BASOS ABS: 0 10*3/uL (ref 0.0–0.1)
Basophils Relative: 0 % (ref 0–1)
Eosinophils Absolute: 0.2 10*3/uL (ref 0.0–0.7)
Eosinophils Relative: 3 % (ref 0–5)
HCT: 34.9 % — ABNORMAL LOW (ref 36.0–46.0)
HEMOGLOBIN: 11.3 g/dL — AB (ref 12.0–15.0)
LYMPHS PCT: 34 % (ref 12–46)
Lymphs Abs: 2.7 10*3/uL (ref 0.7–4.0)
MCH: 30.1 pg (ref 26.0–34.0)
MCHC: 32.4 g/dL (ref 30.0–36.0)
MCV: 92.8 fL (ref 78.0–100.0)
MONOS PCT: 9 % (ref 3–12)
Monocytes Absolute: 0.7 10*3/uL (ref 0.1–1.0)
NEUTROS ABS: 4.3 10*3/uL (ref 1.7–7.7)
NEUTROS PCT: 54 % (ref 43–77)
Platelets: 193 10*3/uL (ref 150–400)
RBC: 3.76 MIL/uL — AB (ref 3.87–5.11)
RDW: 14.7 % (ref 11.5–15.5)
WBC: 7.9 10*3/uL (ref 4.0–10.5)

## 2015-06-25 LAB — I-STAT CHEM 8, ED
BUN: 21 mg/dL — AB (ref 6–20)
CREATININE: 0.6 mg/dL (ref 0.44–1.00)
Calcium, Ion: 1.17 mmol/L (ref 1.12–1.23)
Chloride: 100 mmol/L — ABNORMAL LOW (ref 101–111)
GLUCOSE: 93 mg/dL (ref 65–99)
HEMATOCRIT: 35 % — AB (ref 36.0–46.0)
Hemoglobin: 11.9 g/dL — ABNORMAL LOW (ref 12.0–15.0)
Potassium: 3.8 mmol/L (ref 3.5–5.1)
Sodium: 141 mmol/L (ref 135–145)
TCO2: 27 mmol/L (ref 0–100)

## 2015-06-25 MED ORDER — HYDROMORPHONE HCL 1 MG/ML IJ SOLN
1.0000 mg | Freq: Once | INTRAMUSCULAR | Status: AC
  Administered 2015-06-25: 1 mg via INTRAVENOUS
  Filled 2015-06-25: qty 1

## 2015-06-25 MED ORDER — VANCOMYCIN HCL IN DEXTROSE 1-5 GM/200ML-% IV SOLN
1000.0000 mg | Freq: Once | INTRAVENOUS | Status: AC
  Administered 2015-06-25: 1000 mg via INTRAVENOUS
  Filled 2015-06-25: qty 200

## 2015-06-25 MED ORDER — SODIUM CHLORIDE 0.9 % IV BOLUS (SEPSIS)
1000.0000 mL | Freq: Once | INTRAVENOUS | Status: AC
  Administered 2015-06-25: 1000 mL via INTRAVENOUS

## 2015-06-25 MED ORDER — FENTANYL CITRATE (PF) 100 MCG/2ML IJ SOLN
50.0000 ug | Freq: Once | INTRAMUSCULAR | Status: AC
  Administered 2015-06-25: 50 ug via INTRAVENOUS
  Filled 2015-06-25: qty 2

## 2015-06-25 MED ORDER — FENTANYL CITRATE (PF) 100 MCG/2ML IJ SOLN
50.0000 ug | Freq: Once | INTRAMUSCULAR | Status: AC
  Administered 2015-06-25: 50 ug via INTRAVENOUS

## 2015-06-25 MED ORDER — FENTANYL CITRATE (PF) 100 MCG/2ML IJ SOLN
INTRAMUSCULAR | Status: AC
  Filled 2015-06-25: qty 2

## 2015-06-25 NOTE — ED Notes (Signed)
Papular rash on bilateral upper arms- states rash started after taking Doxycycline several days ago

## 2015-06-25 NOTE — ED Notes (Signed)
EMS at bedside for transportation.

## 2015-06-25 NOTE — ED Notes (Signed)
Surgery on her right foot to remove cyst 4 weeks ago. Here for wound check and pain.

## 2015-06-25 NOTE — ED Provider Notes (Signed)
CSN: [id number]     Arrival date & time 06/25/15  1713 History  This chart was scribed for Jerelyn Scott, MD by Lyndel Safe, ED Scribe. This patient was seen in room MH08/MH08 and the patient's care was started 6:30 PM.   Chief Complaint  Patient presents with  . Foot Pain   Patient is a 34 y.o. female presenting with lower extremity pain. The history is provided by the patient. No language interpreter was used.  Foot Pain This is a new problem. The current episode started more than 1 week ago. The problem occurs constantly. The problem has been rapidly worsening. The symptoms are aggravated by walking. Nothing relieves the symptoms. Treatments tried: pain medication and antibiotics  The treatment provided no relief.   HPI Comments: Stacy Pratt is a 34 y.o. female, with a PMhx of MRSA, arthritis and osteoarthritis, who presents to the Emergency Department complaining of progressively worsening, constant, moderate right foot pain s/p surgery 3 weeks ago. There is an associated open wound with erythema and active bloody drainage. Pt had surgery to her right foot 3 weeks ago by Dr. Thamas Jaegers with [facility] to remove ganglion cysts. . She followed up with orthopedics after the surgery before the surgical site had worsened and they prescribed her pain medication. Pt reports 2 days after the stiches were removed 11 days ago, butterfly stitches were placed and the wound was left partially open. Ever since the removal of the butterfly stitches 6 days ago pt reports the wound has worsened with erythema and drainage. The stitches were removed at [facility] 6 days ago where they discharged pt with prednisone and bactrim which pt has been compliant with. She reports finishing the bactrim course 2 days ago. She additionally complains of a gradually improving rash to bilateral upper arms with onset 3 days ago after starting a Doxycycline course. She was prescribed doxycyline at [facility] after calling EMS due an exacerbation of her right foot pain when ambulating. Pta the pt was advised by her PCP Dr. Quentin Ore to come to the ED due to her impression that pt would need IV antibiotics and admission. . Denies fever.  Allergy to penicillin.   Past Medical History  Diagnosis Date  . Arthritis   . MRSA (methicillin resistant Staphylococcus aureus)   . Osteoarthritis   . Seizures     last seizure Jan 2016   Past Surgical History  Procedure Laterality Date  . Mrsa    . Abdominal surgery    . Knee surgery    . Replacement total knee bilateral     No family history on file. History  Substance Use Topics  . Smoking status: Current Every Day Smoker -- 0.50 packs/day    Types: Cigarettes  . Smokeless tobacco: Never Used  . Alcohol Use: No   OB History    No data available     Review of Systems  Constitutional: Negative for fever.  Musculoskeletal: Positive for arthralgias.  Skin: Positive for rash and wound.  All other systems reviewed and are negative.  Allergies  Penicillins  Home Medications   Prior to Admission medications   Medication Sig Start Date End Date Taking? Authorizing Provider  clonazePAM (KLONOPIN) 2 MG tablet Take 1 tablet (2 mg total) by mouth 3 (three) times daily. Must Last 30 Days 03/22/15   Micki Riley, MD  divalproex (DEPAKOTE) 500 MG DR tablet TAKE 2 TABLETS BY MOUTH TWICE DAILY 08/11/14   Larita Fife  E Lam, NP  DULoxetine (CYMBALTA) 60 MG capsule Take 180 mg by mouth daily.     Historical Provider, MD  Glucosamine Sulfate 500 MG TABS Take by mouth.    Historical Provider, MD  HYDROcodone-acetaminophen (NORCO/VICODIN) 5-325 MG per tablet Take 1 tablet by mouth every 6 (six) hours as needed for severe pain. 03/17/15   Gerhard Munch, MD  ibuprofen (ADVIL,MOTRIN) 200 MG tablet Take 800 mg by mouth 2 (two) times daily as needed. Patient used this medication for her body pain.    Historical Provider, MD  levETIRAcetam (KEPPRA) 750 MG  tablet Take 1 tablet (750 mg total) by mouth 2 (two) times daily. 08/10/14   Ronal Fear, NP  meloxicam (MOBIC) 15 MG tablet Take 15 mg by mouth daily.    Historical Provider, MD  QUEtiapine (SEROQUEL) 300 MG tablet Take 2 tablets (600 mg total) by mouth at bedtime. 08/10/14   Ronal Fear, NP  traMADol (ULTRAM) 50 MG tablet Take 1 tablet every 6-8 hours prn for pain 03/06/15   Historical Provider, MD   BP 110/61 mmHg  Pulse 85  Temp(Src) 98.2 F (36.8 C) (Oral)  Resp 18  Ht 5\' 1"  (1.549 m)  Wt 184 lb (83.462 kg)  BMI 34.78 kg/m2  SpO2 99%  LMP 06/03/2015 Vitals reviewed Physical Exam  Physical Examination: General appearance - alert, well appearing, and in no distress Mental status - alert, oriented to person, place, and time Eyes -no conjunctival injection, no scleral icterus Chest - clear to auscultation, no wheezes, rales or rhonchi, symmetric air entry Heart - normal rate, regular rhythm, normal S1, S2, no murmurs, rubs, clicks or gallops Neurological - alert, oriented, normal speech Musculoskeletal - lateral aspect of right foot with open wound approx 6cm long and > 1cm deep, erythema surrounding and extending onto dorsum of foot, foot diffusely tender to palpation, 2+ DP pulse, toes are distally NVI Extremities - peripheral pulses normal, no pedal edema, no clubbing or cyanosis Skin - erythema of right foot as described above- warm to touch with open wound associated, otherwise normal coloration and turgor, no rashes  ED Course  Procedures  DIAGNOSTIC STUDIES: Oxygen Saturation is 99% on RA, normal by my interpretation.    COORDINATION OF CARE: 6:43 PM Discussed treatment plan with pt. Will order IV antibiotics. Discussed results of Xray of right foot with pt. Pt acknowledges and agrees to plan.   Labs Review Labs Reviewed  CBC WITH DIFFERENTIAL/PLATELET - Abnormal; Notable for the following:    RBC 3.76 (*)    Hemoglobin 11.3 (*)    HCT 34.9 (*)    All other components  within normal limits  I-STAT CHEM 8, ED - Abnormal; Notable for the following:    Chloride 100 (*)    BUN 21 (*)    Hemoglobin 11.9 (*)    HCT 35.0 (*)    All other components within normal limits    Imaging Review Dg Foot Complete Right  06/25/2015   CLINICAL DATA:  History of foot surgery 4 weeks ago now with a open wound.  EXAM: RIGHT FOOT COMPLETE - 3+ VIEW  COMPARISON:  Radiographs 06/23/2015  FINDINGS: Persistent large open wound on the lateral aspect of the foot. No definite underlying destructive bony changes to suggest osteomyelitis. Stable advanced degenerative changes noted at the tibiotalar joint.  IMPRESSION: Persistent large open wound on the lateral aspect of the foot without definite plain film findings for osteomyelitis.   Electronically Signed   By:  Rudie Meyer M.D.   On: 06/25/2015 18:21     EKG Interpretation None      MDM   Final diagnoses:  Cellulitis of right lower extremity    Pt presenting with c/o pain, redness and increasing size of open wound on right foot.  She had surgical procedure several weeks ago, began to develop celulitis and wound opened up, has been on one week of bactrim without improvement, given rx for doxycycline 4 days ago which she wasn't able to take due to development of rash.  Pt admitted to [facility] for IV abx due to failed outpatient treatment.  Xray did not show signs of osteomyelitis.    I personally performed the services described in this documentation, which was scribed in my presence. The recorded information has been reviewed and is accurate.  7:31 PM  D/w hospitalist at [facility], Dr. Johny Drilling, he will talk to nursing supervisor and they will call back with a bed assignment.    Jerelyn Scott, MD 06/26/15 [phone]

## 2015-06-25 NOTE — ED Notes (Signed)
Report received and care assumed.

## 2015-06-25 NOTE — ED Notes (Signed)
Right foot dressing applied.  

## 2015-06-25 NOTE — ED Notes (Signed)
Patient transported to CT 

## 2015-06-25 NOTE — ED Notes (Signed)
Loose dressing placed on foot per Nurse Lawson Fiscal request so patient can go to restroom. Patient family assisting patient to restroom via wheelchair.

## 2015-07-16 ENCOUNTER — Other Ambulatory Visit: Payer: Self-pay

## 2015-07-16 MED ORDER — DIVALPROEX SODIUM 500 MG PO DR TAB: 1000.0000 mg | DELAYED_RELEASE_TABLET | Freq: Two times a day (BID) | ORAL | Status: DC

## 2015-07-16 MED ORDER — LEVETIRACETAM 750 MG PO TABS: 750.0000 mg | ORAL_TABLET | Freq: Two times a day (BID) | ORAL | Status: DC

## 2015-08-06 ENCOUNTER — Telehealth: Payer: Self-pay | Admitting: Neurology

## 2015-08-06 NOTE — Telephone Encounter (Signed)
It appears a 6 month Rx was written on 04/28.  I called the pharmacy and spoke with pharmacist, who verified the patient does has 1 refill remaining, however they told her they will not fill it until 09/21, as it is too soon to fill at this time.  I called the patient back to relay info provided by the pharmacy.  She expressed understanding and will cal back if anything further is needed.

## 2015-08-06 NOTE — Telephone Encounter (Signed)
Pt called in because [facility]) refills are out.  She said she will run out on 08/15/15.  Her best number is [phone].

## 2015-08-28 ENCOUNTER — Ambulatory Visit: Payer: Medicaid Other | Admitting: Adult Health

## 2015-08-29 ENCOUNTER — Encounter: Payer: Self-pay | Admitting: Adult Health

## 2015-09-06 ENCOUNTER — Ambulatory Visit (INDEPENDENT_AMBULATORY_CARE_PROVIDER_SITE_OTHER): Payer: Medicaid Other | Admitting: Adult Health

## 2015-09-06 ENCOUNTER — Encounter: Payer: Self-pay | Admitting: Adult Health

## 2015-09-06 VITALS — BP 120/69 | HR 78 | Ht 61.0 in | Wt 182.0 lb

## 2015-09-06 DIAGNOSIS — Z5181 Encounter for therapeutic drug level monitoring: Secondary | ICD-10-CM | POA: Diagnosis not present

## 2015-09-06 DIAGNOSIS — G40319 Generalized idiopathic epilepsy and epileptic syndromes, intractable, without status epilepticus: Secondary | ICD-10-CM

## 2015-09-06 MED ORDER — CLONAZEPAM 2 MG PO TABS: 2.0000 mg | ORAL_TABLET | Freq: Three times a day (TID) | ORAL | Status: DC

## 2015-09-06 MED ORDER — DIVALPROEX SODIUM 500 MG PO DR TAB: 1000.0000 mg | DELAYED_RELEASE_TABLET | Freq: Two times a day (BID) | ORAL | Status: DC

## 2015-09-06 MED ORDER — LEVETIRACETAM 750 MG PO TABS: 750.0000 mg | ORAL_TABLET | Freq: Two times a day (BID) | ORAL | Status: DC
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
## 2015-09-06 NOTE — Patient Instructions (Signed)
Continue Klonopin, depakote and Keppra. Refills sent If you have any seizures please let us know.  I will check blood work today

## 2015-09-07 LAB — COMPREHENSIVE METABOLIC PANEL
ALT: 17 IU/L (ref 0–32)
AST: 13 IU/L (ref 0–40)
Albumin/Globulin Ratio: 1.2 (ref 1.1–2.5)
Albumin: 4.2 g/dL (ref 3.5–5.5)
Alkaline Phosphatase: 68 IU/L (ref 39–117)
BUN/Creatinine Ratio: 25 — ABNORMAL HIGH (ref 8–20)
BUN: 15 mg/dL (ref 6–20)
Bilirubin Total: 0.4 mg/dL (ref 0.0–1.2)
CO2: 25 mmol/L (ref 18–29)
CREATININE: 0.59 mg/dL (ref 0.57–1.00)
Calcium: 9.9 mg/dL (ref 8.7–10.2)
Chloride: 96 mmol/L — ABNORMAL LOW (ref 97–108)
GFR calc Af Amer: 138 mL/min/{1.73_m2} (ref >59)
GFR calc non Af Amer: 120 mL/min/{1.73_m2} (ref >59)
GLOBULIN, TOTAL: 3.4 g/dL (ref 1.5–4.5)
Glucose: 105 mg/dL — ABNORMAL HIGH (ref 65–99)
Potassium: 4.4 mmol/L (ref 3.5–5.2)
SODIUM: 138 mmol/L (ref 134–144)
Total Protein: 7.6 g/dL (ref 6.0–8.5)

## 2015-09-07 LAB — CBC WITH DIFFERENTIAL/PLATELET
BASOS: 0 %
Basophils Absolute: 0 10*3/uL (ref 0.0–0.2)
EOS (ABSOLUTE): 0.2 10*3/uL (ref 0.0–0.4)
EOS: 3 %
HEMOGLOBIN: 13.5 g/dL (ref 11.1–15.9)
Hematocrit: 40.3 % (ref 34.0–46.6)
IMMATURE GRANS (ABS): 0 10*3/uL (ref 0.0–0.1)
Immature Granulocytes: 0 %
Lymphocytes Absolute: 2.7 10*3/uL (ref 0.7–3.1)
Lymphs: 34 %
MCH: 31.3 pg (ref 26.6–33.0)
MCHC: 33.5 g/dL (ref 31.5–35.7)
MCV: 93 fL (ref 79–97)
MONOCYTES: 6 %
Monocytes Absolute: 0.5 10*3/uL (ref 0.1–0.9)
Neutrophils Absolute: 4.6 10*3/uL (ref 1.4–7.0)
Neutrophils: 57 %
Platelets: 232 10*3/uL (ref 150–379)
RBC: 4.32 x10E6/uL (ref 3.77–5.28)
RDW: 14.6 % (ref 12.3–15.4)
WBC: 8.1 10*3/uL (ref 3.4–10.8)

## 2015-09-07 LAB — VALPROIC ACID LEVEL: Valproic Acid Lvl: 87 ug/mL (ref 50–100)

## 2015-09-07 LAB — AMMONIA: Ammonia: 74 ug/dL (ref 19–87)

## 2015-09-07 NOTE — Progress Notes (Signed)
I agree with the above plan 

## 2015-09-10 ENCOUNTER — Telehealth: Payer: Self-pay

## 2015-09-10 NOTE — Telephone Encounter (Signed)
Spoke to caregiver HCA IncFay Pratt. Gave results. She verbalized understanding.

## 2015-09-10 NOTE — Telephone Encounter (Signed)
-----   Message from Butch PennyMegan Millikan, NP sent at 09/10/2015  7:27 AM EDT ----- Lab work is ok. Please call the patient.

## 2015-09-21 ENCOUNTER — Ambulatory Visit: Payer: Medicaid Other | Admitting: Adult Health

## 2015-09-24 ENCOUNTER — Ambulatory Visit: Payer: Medicaid Other | Admitting: Adult Health

## 2016-01-31 ENCOUNTER — Telehealth: Payer: Self-pay | Admitting: Adult Health

## 2016-01-31 MED ORDER — CLONAZEPAM 2 MG PO TABS: 2.0000 mg | ORAL_TABLET | Freq: Three times a day (TID) | ORAL | Status: DC

## 2016-01-31 NOTE — Telephone Encounter (Addendum)
Pt called requesting refill for clonazePAM (KLONOPIN) 2 MG tablet . She thinks she has 1 refill left. Pt sts she had gran mal seizure yesterday 01/30/16. Pt's phone dropped the call at this point. I could not reach the pt to finish conversation. Pt called back said she when she was taking clonazePAM (KLONOPIN) 2 MG tablet 4 tabs she did have any gran mall seizure or dizzy spells. She is requesting to increase directions and qty.

## 2016-01-31 NOTE — Telephone Encounter (Signed)
I called the patient and left a message. I will refill her Klonopin however I would like to have more information in regards to her seizure event. I will try calling her again tomorrow

## 2016-01-31 NOTE — Telephone Encounter (Signed)
Stacy Pratt- please advise, thank you! She last saw you in October and has a f/u w/ you on 4/13.

## 2016-02-01 MED ORDER — LEVETIRACETAM 1000 MG PO TABS: 1000.0000 mg | ORAL_TABLET | Freq: Two times a day (BID) | ORAL | Status: DC

## 2016-02-01 NOTE — Telephone Encounter (Signed)
I called the patient. She states that she has been having "little seizures" which is describes as staring off for several seconds. She states that when she starts to have these types of seizures she knows she will have a "big" seizure. She states on 3/8 she was in [employer] and fell to the floor. Her finance told her that she was shaking in all 4 extremities. She was drooling and urinated on herself. She states that afterwards she was very sleepy. She reports that it took 2 days to recover. She denies missing any medication. She does suffer from insomnia and was recently placed on Seroquel by PCP. I will increase Keppra to 1000 mg BID. She will remain on depakote and Klonopin. She has an appointment with me next month.

## 2016-02-01 NOTE — Addendum Note (Signed)
Addended by: Enedina FinnerMILLIKAN, Emelee Rodocker P on: 02/01/2016 09:45 AM   Modules accepted: Orders

## 2016-02-04 NOTE — Telephone Encounter (Signed)
I faxed RX for klonopin to [facility] and received a receipt of confirmation.

## 2016-03-06 ENCOUNTER — Telehealth: Payer: Self-pay

## 2016-03-06 ENCOUNTER — Encounter: Payer: Self-pay | Admitting: *Deleted

## 2016-03-06 ENCOUNTER — Ambulatory Visit (INDEPENDENT_AMBULATORY_CARE_PROVIDER_SITE_OTHER): Payer: Medicaid Other | Admitting: Adult Health

## 2016-03-06 ENCOUNTER — Encounter: Payer: Self-pay | Admitting: Adult Health

## 2016-03-06 VITALS — BP 118/71 | HR 101 | Ht 61.0 in | Wt 182.2 lb

## 2016-03-06 DIAGNOSIS — R569 Unspecified convulsions: Secondary | ICD-10-CM | POA: Diagnosis not present

## 2016-03-06 DIAGNOSIS — Z5181 Encounter for therapeutic drug level monitoring: Secondary | ICD-10-CM | POA: Diagnosis not present

## 2016-03-06 MED ORDER — LEVETIRACETAM 1000 MG PO TABS: ORAL_TABLET | ORAL | Status: DC

## 2016-03-06 MED ORDER — CLONAZEPAM 2 MG PO TABS: 2.0000 mg | ORAL_TABLET | Freq: Three times a day (TID) | ORAL | Status: DC

## 2016-03-06 NOTE — Patient Instructions (Signed)
Increase Keppra to 1 1/2 tablets twice a day Continue Depakote and klonopin If your symptoms worsen or you develop new symptoms please let us know.
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
## 2016-03-06 NOTE — Progress Notes (Signed)
I agree with the above plan 

## 2016-03-06 NOTE — Telephone Encounter (Signed)
RX for Con[facility]. Received a receipt of confirmation.

## 2016-03-07 ENCOUNTER — Telehealth: Payer: Self-pay | Admitting: Adult Health

## 2016-03-07 LAB — COMPREHENSIVE METABOLIC PANEL
A/G RATIO: 1.1 — AB (ref 1.2–2.2)
ALT: 11 IU/L (ref 0–32)
AST: 14 IU/L (ref 0–40)
Albumin: 4.1 g/dL (ref 3.5–5.5)
Alkaline Phosphatase: 62 IU/L (ref 39–117)
BUN/Creatinine Ratio: 25 — ABNORMAL HIGH (ref 9–23)
BUN: 18 mg/dL (ref 6–20)
Bilirubin Total: 0.6 mg/dL (ref 0.0–1.2)
CO2: 27 mmol/L (ref 18–29)
Calcium: 9.6 mg/dL (ref 8.7–10.2)
Chloride: 100 mmol/L (ref 96–106)
Creatinine, Ser: 0.72 mg/dL (ref 0.57–1.00)
GFR calc Af Amer: 125 mL/min/{1.73_m2} (ref >59)
GFR, EST NON AFRICAN AMERICAN: 109 mL/min/{1.73_m2} (ref >59)
Globulin, Total: 3.6 g/dL (ref 1.5–4.5)
Glucose: 102 mg/dL — ABNORMAL HIGH (ref 65–99)
POTASSIUM: 5.4 mmol/L — AB (ref 3.5–5.2)
SODIUM: 143 mmol/L (ref 134–144)
Total Protein: 7.7 g/dL (ref 6.0–8.5)

## 2016-03-07 LAB — CBC WITH DIFFERENTIAL/PLATELET
BASOS: 0 %
Basophils Absolute: 0 10*3/uL (ref 0.0–0.2)
EOS (ABSOLUTE): 0.4 10*3/uL (ref 0.0–0.4)
EOS: 4 %
HEMATOCRIT: 40.4 % (ref 34.0–46.6)
HEMOGLOBIN: 13.8 g/dL (ref 11.1–15.9)
Immature Grans (Abs): 0 10*3/uL (ref 0.0–0.1)
Immature Granulocytes: 0 %
LYMPHS ABS: 3.5 10*3/uL — AB (ref 0.7–3.1)
Lymphs: 42 %
MCH: 30.9 pg (ref 26.6–33.0)
MCHC: 34.2 g/dL (ref 31.5–35.7)
MCV: 90 fL (ref 79–97)
Monocytes Absolute: 0.6 10*3/uL (ref 0.1–0.9)
Monocytes: 8 %
Neutrophils Absolute: 3.8 10*3/uL (ref 1.4–7.0)
Neutrophils: 46 %
Platelets: 191 10*3/uL (ref 150–379)
RBC: 4.47 x10E6/uL (ref 3.77–5.28)
RDW: 14.3 % (ref 12.3–15.4)
WBC: 8.2 10*3/uL (ref 3.4–10.8)

## 2016-03-07 LAB — VALPROIC ACID LEVEL: Valproic Acid Lvl: 99 ug/mL (ref 50–100)

## 2016-03-07 NOTE — Telephone Encounter (Signed)
I called the patient. Her lab work was normal and consistent with previous lab work. Left a message with these results. She was instructed to call if she had any questions.

## 2016-04-22 ENCOUNTER — Telehealth: Payer: Self-pay | Admitting: Neurology

## 2016-04-22 NOTE — Telephone Encounter (Signed)
Message For: OFFICE               Taken 30-MAY-17 at  2:24PM by JAK ------------------------------------------------------------  [facility]                CID  [id number]   Patient  SAME                  Pt's Dr  Ethelene BrownsMILLIKAN      Area Code  336  Phone#  [phone] *  DOB  [birth year]     RE  WHEN IS HER NEXT APPT? MAY LEAVE DATE/TIME ON     ANSWERING MACHINE                                     Disp:Y/N  N  If Y = C/B If No Response In 20minutes  ============================================================  I spoke with pt about date and time of appt. She expressed understanding

## 2016-05-09 ENCOUNTER — Other Ambulatory Visit: Payer: Self-pay | Admitting: Neurology

## 2016-05-09 DIAGNOSIS — G40319 Generalized idiopathic epilepsy and epileptic syndromes, intractable, without status epilepticus: Secondary | ICD-10-CM

## 2016-05-09 MED ORDER — LEVETIRACETAM 750 MG PO TABS: 1500.0000 mg | ORAL_TABLET | Freq: Two times a day (BID) | ORAL | Status: DC

## 2016-05-09 MED ORDER — CLONAZEPAM 2 MG PO TABS: 2.0000 mg | ORAL_TABLET | Freq: Three times a day (TID) | ORAL | Status: DC

## 2016-05-09 MED ORDER — DIVALPROEX SODIUM 500 MG PO DR TAB: 1000.0000 mg | DELAYED_RELEASE_TABLET | Freq: Two times a day (BID) | ORAL | Status: DC
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
## 2016-05-13 ENCOUNTER — Other Ambulatory Visit: Payer: Self-pay

## 2016-05-13 DIAGNOSIS — G40319 Generalized idiopathic epilepsy and epileptic syndromes, intractable, without status epilepticus: Secondary | ICD-10-CM

## 2016-05-13 MED ORDER — LEVETIRACETAM 750 MG PO TABS: 1500.0000 mg | ORAL_TABLET | Freq: Two times a day (BID) | ORAL | Status: DC

## 2016-06-09 ENCOUNTER — Telehealth: Payer: Self-pay | Admitting: Adult Health

## 2016-06-09 DIAGNOSIS — G40319 Generalized idiopathic epilepsy and epileptic syndromes, intractable, without status epilepticus: Secondary | ICD-10-CM

## 2016-06-09 MED ORDER — CLONAZEPAM 2 MG PO TABS: ORAL_TABLET | ORAL | Status: DC

## 2016-06-09 MED ORDER — DIVALPROEX SODIUM 500 MG PO DR TAB: 1000.0000 mg | DELAYED_RELEASE_TABLET | Freq: Two times a day (BID) | ORAL | Status: DC

## 2016-06-09 MED ORDER — LEVETIRACETAM 750 MG PO TABS: 1500.0000 mg | ORAL_TABLET | Freq: Two times a day (BID) | ORAL | Status: DC
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
## 2016-06-09 NOTE — Telephone Encounter (Addendum)
Pt called back requesting Megan to call asap. I advised her to go to the ED, she sts she can not afford to go to the ED.

## 2016-06-09 NOTE — Telephone Encounter (Signed)
Agree with above plan as stated
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
## 2016-06-10 NOTE — Telephone Encounter (Signed)
[facility] She said that pt has filled clonazepam on 6/13, 6/16 and 7/14 and returned for another refill today. Explained to pharmacist that pt is being weaned off of this medication. She agreed to close out all previous refills on this med and will refill w/ weaning instructions today.

## 2016-06-10 NOTE — Telephone Encounter (Signed)
Jennifer/[facility] [address] [phone] called to advise, patient is requesting override of clonazePAM (KLONOPIN) 2 MG tablet, states Rx was filled early in June, has gotten 3 month supple in less than a month, this is the 2nd time.

## 2016-06-10 NOTE — Telephone Encounter (Signed)
Rx w/ new instructions for clonazepam printed, signed and faxed to pt's pharmacy.

## 2016-07-03 ENCOUNTER — Telehealth: Payer: Self-pay | Admitting: Adult Health

## 2016-07-03 DIAGNOSIS — G40319 Generalized idiopathic epilepsy and epileptic syndromes, intractable, without status epilepticus: Secondary | ICD-10-CM

## 2016-07-03 MED ORDER — CLONAZEPAM 1 MG PO TABS: ORAL_TABLET | ORAL | 0 refills | Status: DC

## 2016-07-03 NOTE — Telephone Encounter (Addendum)
Patient aware that new rx has been sent to the pharmacy [facility] - fax [phone]).  Reviewed weaning off instructions and she verbalized understanding.

## 2016-07-03 NOTE — Telephone Encounter (Addendum)
Patient needs to continue to wean off Klonopin.  I will call in new prescription for 1 mg.  She will take 1 tablet BID for 2 weeks. Then 1 tablet daily for 1 week then 1/2 tablet daily for 1 week then stop. Please call and let patient know. I have written a prescription.

## 2016-08-06 NOTE — Telephone Encounter (Signed)
Please call her on cell [phone]

## 2016-08-06 NOTE — Telephone Encounter (Signed)
Klonopin is a week seizure medication and if she is having true seizures I would recommend increasing her other seizure medications if she is willing.
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
## 2016-08-06 NOTE — Telephone Encounter (Signed)
Pt called to advise that since she has weaned off the medication she is having 2-3 "grand mall" seizure /week. She said she did not have these when she was taking the medication. The last one was Sunday 08/03/16. She was alone with this seizure. She said she is having incontinence with them. Please call

## 2016-08-07 NOTE — Telephone Encounter (Signed)
Kindly offer  an appointment to see nurse practitioner at the earliest and to discuss increase in seizure medications but do not prescribe Klonopin

## 2016-08-07 NOTE — Telephone Encounter (Signed)
I spoke to pt and let her know that klonopin is a weak sz medication.  If true sz would recommend increasing her other medications.  NO KLONOPIN.    Depakote 1000mg  po bid and keppra 1500mg  po bid is what she is taking now.    She was ok to do what you suggest.  Please advise.

## 2016-08-07 NOTE — Telephone Encounter (Signed)
Spoke to pt and she will come in Tuesday 08-12-16 1430 due to breakthru sz (off klonopin).  See other phone note.

## 2016-08-08 NOTE — Telephone Encounter (Signed)
Appt made with MM/NP next week to discuss.  08-12-16 at 1430.

## 2016-08-12 ENCOUNTER — Ambulatory Visit (INDEPENDENT_AMBULATORY_CARE_PROVIDER_SITE_OTHER): Payer: Medicaid Other | Admitting: Adult Health

## 2016-08-12 ENCOUNTER — Encounter: Payer: Self-pay | Admitting: Adult Health

## 2016-08-12 VITALS — BP 98/57 | HR 69 | Ht 61.0 in | Wt 186.8 lb

## 2016-08-12 DIAGNOSIS — Z5181 Encounter for therapeutic drug level monitoring: Secondary | ICD-10-CM

## 2016-08-12 DIAGNOSIS — R569 Unspecified convulsions: Secondary | ICD-10-CM | POA: Diagnosis not present
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
## 2016-08-12 NOTE — Patient Instructions (Signed)
Blood work today Referral for video EEG monitoring Continue Depakote and Keppra May need to increase Depakote pending blood work If your symptoms worsen or you develop new symptoms please let us know.

## 2016-08-13 ENCOUNTER — Telehealth: Payer: Self-pay | Admitting: *Deleted

## 2016-08-13 NOTE — Telephone Encounter (Signed)
Fax confirmation received for ofv notes (last 2).

## 2016-08-13 NOTE — Progress Notes (Signed)
I reviewed above note and agree with the assessment and plan.  Marvel PlanJindong Linell Shawn, MD PhD Stroke Neurology 08/13/2016 6:49 PM

## 2016-08-14 LAB — CBC WITH DIFFERENTIAL/PLATELET
BASOS ABS: 0 10*3/uL (ref 0.0–0.2)
BASOS: 0 %
EOS (ABSOLUTE): 0.1 10*3/uL (ref 0.0–0.4)
EOS: 2 %
HEMOGLOBIN: 13.6 g/dL (ref 11.1–15.9)
Hematocrit: 41.5 % (ref 34.0–46.6)
IMMATURE GRANS (ABS): 0 10*3/uL (ref 0.0–0.1)
Immature Granulocytes: 0 %
LYMPHS: 38 %
Lymphocytes Absolute: 3.2 10*3/uL — ABNORMAL HIGH (ref 0.7–3.1)
MCH: 30.6 pg (ref 26.6–33.0)
MCHC: 32.8 g/dL (ref 31.5–35.7)
MCV: 93 fL (ref 79–97)
MONOCYTES: 7 %
Monocytes Absolute: 0.6 10*3/uL (ref 0.1–0.9)
NEUTROS ABS: 4.5 10*3/uL (ref 1.4–7.0)
Neutrophils: 53 %
Platelets: 167 10*3/uL (ref 150–379)
RBC: 4.45 x10E6/uL (ref 3.77–5.28)
RDW: 13.8 % (ref 12.3–15.4)
WBC: 8.5 10*3/uL (ref 3.4–10.8)

## 2016-08-14 LAB — COMPREHENSIVE METABOLIC PANEL
A/G RATIO: 1.3 (ref 1.2–2.2)
ALT: 23 IU/L (ref 0–32)
AST: 22 IU/L (ref 0–40)
Albumin: 4.2 g/dL (ref 3.5–5.5)
Alkaline Phosphatase: 59 IU/L (ref 39–117)
BUN/Creatinine Ratio: 26 — ABNORMAL HIGH (ref 9–23)
BUN: 17 mg/dL (ref 6–20)
Bilirubin Total: 0.3 mg/dL (ref 0.0–1.2)
CO2: 23 mmol/L (ref 18–29)
Calcium: 9 mg/dL (ref 8.7–10.2)
Chloride: 100 mmol/L (ref 96–106)
Creatinine, Ser: 0.66 mg/dL (ref 0.57–1.00)
GFR calc Af Amer: 132 mL/min/{1.73_m2} (ref >59)
GFR calc non Af Amer: 115 mL/min/{1.73_m2} (ref >59)
Globulin, Total: 3.3 g/dL (ref 1.5–4.5)
Glucose: 116 mg/dL — ABNORMAL HIGH (ref 65–99)
POTASSIUM: 4.4 mmol/L (ref 3.5–5.2)
Sodium: 140 mmol/L (ref 134–144)
Total Protein: 7.5 g/dL (ref 6.0–8.5)

## 2016-08-14 LAB — LEVETIRACETAM LEVEL: LEVETIRACETAM: 19.7 ug/mL (ref 10.0–40.0)

## 2016-08-14 LAB — VALPROIC ACID LEVEL: VALPROIC ACID LVL: 89 ug/mL (ref 50–100)

## 2016-08-19 LAB — COMPREHENSIVE DRUG ANALYSIS,UR

## 2016-08-21 ENCOUNTER — Telehealth: Payer: Self-pay | Admitting: Adult Health

## 2016-08-21 DIAGNOSIS — G40319 Generalized idiopathic epilepsy and epileptic syndromes, intractable, without status epilepticus: Secondary | ICD-10-CM

## 2016-08-21 MED ORDER — LEVETIRACETAM 750 MG PO TABS: 1500.0000 mg | ORAL_TABLET | Freq: Two times a day (BID) | ORAL | 3 refills | Status: DC

## 2016-08-21 NOTE — Telephone Encounter (Signed)
I called the patient. Her blood work is unremarkable. After talking to the patient she has been taking her medication incorrectly. She has only been taking one tablet of Depakote for a total of 1000 mg daily. She should be taken at 1000 mg twice a day. She now understands that she should be taking 2 tablets twice a day. The patient also reports that she has Keppra 1000 mg tablet has been taking twice a day however she should be on the 750 mg tablet taking 1500 mg twice a day. I have repeated the instructions with her 5 times and she verbalized understanding. I will send a new prescription in for Keppra.

## 2016-08-28 ENCOUNTER — Telehealth: Payer: Self-pay | Admitting: *Deleted

## 2016-08-28 NOTE — Telephone Encounter (Signed)
Noted  

## 2016-08-28 NOTE — Telephone Encounter (Signed)
Pt medical records faxed to Dr Yvette Rackegina Mozingo @ [phone].

## 2016-08-28 NOTE — Telephone Encounter (Signed)
Pt called in stating she is not having any more seizures since her medication has been increased. She does not want to [facility], unless she starts to have more seizures.  [phone]

## 2016-09-01 NOTE — Telephone Encounter (Signed)
Noted Dcox  °

## 2016-09-15 ENCOUNTER — Ambulatory Visit: Payer: Medicaid Other | Admitting: Neurology

## 2016-10-19 ENCOUNTER — Other Ambulatory Visit: Payer: Self-pay | Admitting: Adult Health

## 2016-12-22 ENCOUNTER — Encounter: Payer: Self-pay | Admitting: Neurology

## 2016-12-22 ENCOUNTER — Ambulatory Visit (INDEPENDENT_AMBULATORY_CARE_PROVIDER_SITE_OTHER): Payer: Medicaid Other | Admitting: Neurology

## 2016-12-22 VITALS — BP 108/60 | HR 84 | Ht 61.0 in | Wt 193.0 lb

## 2016-12-22 DIAGNOSIS — G40209 Localization-related (focal) (partial) symptomatic epilepsy and epileptic syndromes with complex partial seizures, not intractable, without status epilepticus: Secondary | ICD-10-CM

## 2016-12-22 MED ORDER — DIVALPROEX SODIUM 500 MG PO DR TAB: 1000.0000 mg | DELAYED_RELEASE_TABLET | Freq: Two times a day (BID) | ORAL | 3 refills | Status: DC

## 2016-12-22 NOTE — Patient Instructions (Signed)
I had a long discussion the patient and her fianc regarding her chronic epilepsy and breakthrough seizures. I counseled her to be compliant with her medications and to avoid sleep deprivation. Increase Depakote dose to thousand milligrams in the morning and 1500 at night. Continue Keppra in the current dose of 1500 twice daily. Check levels of valproic acid, Keppra as well as CMP and ammonia levels. I explained to her that Klonopin is not good seizure medication in my opinion and if her psychiatrist wants to prescribe it for behavioral health issues am okay with it .Return for follow-up in 3 months with my nurse practitioner or call earlier if necessary
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
## 2016-12-29 ENCOUNTER — Telehealth: Payer: Self-pay | Admitting: Neurology

## 2016-12-29 NOTE — Telephone Encounter (Signed)
Stacy Pratt/[facility] [address] [phone] needs clarification on directions for divalproex (DEPAKOTE) 500 MG DR tablet .

## 2016-12-29 NOTE — Telephone Encounter (Signed)
Rn call Dr.Sethi. Per Dr. Pearlean BrownieSethi he wants pt to take 2 tablets in the am and three tablets at night. Per Dr.Sethi he was unable to delete the previous instructions.Also this is stated in his note.

## 2016-12-29 NOTE — Telephone Encounter (Signed)
My note states will increase depakote to 1000 mg in am ( two 500mg  tablets) and 1500mg   ( three 500mg  tablets) at night but I was unable to change that when I renewed the prescription but that is what I want

## 2016-12-29 NOTE — Telephone Encounter (Signed)
Dr.Sethi the pharmacy needs clarification on the depkote you prescribed for patient. divalproex (DEPAKOTE) 500 MG DR tablet  Quantity: 120 tablet  Refills: 3  Start: 12/22/2016  Sig : Take 2 tablets (1,000 mg total) by mouth 2 (two) times daily. Take two tablets by mouth in am and three tablets at night  Should the pt be taking 2 tablets by mouth 2 times a day, or two tablets in the am and three at night. Please clarify so I can cal the pharmacy back.

## 2016-12-29 NOTE — Telephone Encounter (Signed)
Rn spoke with  Corrie [facility]Mary at [facility] at [phone]. Rn gave her DR. Sethi instructions of 2 tablets in the am and 3 at night. Mary verbalized understanding.

## 2017-03-25 ENCOUNTER — Ambulatory Visit: Payer: Medicaid Other | Admitting: Adult Health

## 2017-03-26 ENCOUNTER — Encounter: Payer: Self-pay | Admitting: Adult Health

## 2017-07-14 ENCOUNTER — Telehealth: Payer: Self-pay | Admitting: Adult Health

## 2017-07-14 MED ORDER — DIVALPROEX SODIUM 500 MG PO DR TAB: DELAYED_RELEASE_TABLET | ORAL | 3 refills | Status: DC

## 2017-07-14 NOTE — Telephone Encounter (Signed)
Patient requesting refill of divalproex (DEPAKOTE) 500 MG DR tablet called to [facility] on [address] in [place]. She has appointment with Aundra Millet on 09-15-17

## 2017-07-14 NOTE — Addendum Note (Signed)
Addended by: Lindell Spar C on: 07/14/2017 02:46 PM   Modules accepted: Orders

## 2017-07-14 NOTE — Telephone Encounter (Signed)
Prescription sent to pharmacy with enough refills to last until her follow up appt on 09/15/17.

## 2017-09-15 ENCOUNTER — Ambulatory Visit: Payer: Medicaid Other | Admitting: Adult Health

## 2017-09-16 ENCOUNTER — Encounter: Payer: Self-pay | Admitting: Adult Health

## 2017-12-22 ENCOUNTER — Telehealth: Payer: Self-pay | Admitting: Adult Health

## 2017-12-22 DIAGNOSIS — G40319 Generalized idiopathic epilepsy and epileptic syndromes, intractable, without status epilepticus: Secondary | ICD-10-CM

## 2017-12-22 MED ORDER — LEVETIRACETAM 750 MG PO TABS: 1500.0000 mg | ORAL_TABLET | Freq: Two times a day (BID) | ORAL | 3 refills | Status: AC

## 2017-12-22 NOTE — Addendum Note (Signed)
Addended by: [place] PinkHESSON, MARY C on: 12/22/2017 05:13 PM   Modules accepted: Orders

## 2017-12-22 NOTE — Telephone Encounter (Signed)
Pt is needing a Refill on her Keppra 750 MG

## 2017-12-22 NOTE — Telephone Encounter (Signed)
[facility], must keep FU in May.

## 2018-03-10 ENCOUNTER — Ambulatory Visit: Payer: Medicaid Other | Admitting: Adult Health
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
## 2018-04-08 ENCOUNTER — Ambulatory Visit: Payer: Medicaid Other | Admitting: Adult Health

## 2018-04-12 ENCOUNTER — Ambulatory Visit: Payer: Medicaid Other | Admitting: Adult Health

## 2018-04-12 ENCOUNTER — Other Ambulatory Visit: Payer: Self-pay | Admitting: Neurology

## 2018-04-12 ENCOUNTER — Telehealth: Payer: Self-pay | Admitting: Adult Health

## 2018-04-12 ENCOUNTER — Encounter: Payer: Self-pay | Admitting: Adult Health

## 2018-04-12 VITALS — BP 112/62 | HR 72 | Ht 61.0 in | Wt 195.6 lb

## 2018-04-12 DIAGNOSIS — R569 Unspecified convulsions: Secondary | ICD-10-CM | POA: Diagnosis not present

## 2018-04-12 NOTE — Telephone Encounter (Signed)
Patient states she will give [place] a call to schedule 6 mo f/u w/ np, mm.

## 2018-04-12 NOTE — Progress Notes (Signed)
PATIENT: Stacy Pratt DOB: 11/07/1981  REASON FOR VISIT: follow up HISTORY FROM: patient  HISTORY OF PRESENT ILLNESS: .HISTORY Stacy Pratt is a 37 year old female with a history of epilepsy. She returns today for follow-up. She is currently taking Klonopin, Keppra and Depakote. She had a grand mal seizure in March and we increased her Keppra to 1000 mg twice a day. She states that she is not had any additional grand mal seizures although she states that she has "little seizures." She states these events consist of her feel like her heart is  skipping a beat and she has trouble speaking however they only last several seconds. She states that she has these episodes 2-3 times a week. The patient is asking if we can increase Klonopin to 4 times a day. The patient has repeatedly asked [place] to increase her Klonopin. She is able to complete most ADLs independently. She does not operate a motor vehicle without difficulty. She does report today that she has a sister who has "drug issues" that is currently in rehabilitation. She returns today for an evaluation.  HISTORY 09/06/15 (Stacy Pratt): currently taking Klonopin, Keppra and Depakote. She reports that she is not had any seizure episodes in the last 3 months. She states this is when the longest she has gone without having a seizure. She currently lives at home with her fianc. She is able to complete all ADLs independently. She does not operate a motor vehicle. Patient states that she's been taking her medication daily and not missing any doses.   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 Over the years she has been on Depakote ,clonazepam and Keppra. She has not been able to tolerate Lamotriginel in the past due to nausea and vomiting. She recently ran out of lorazepam a week ago and since then has a flurry of seizures. She describes brief complex  partial seizures occurring several times a day which are described as blinking and lasting only a few seconds she speaking she starts stuttering she has a scary feeling and she forgets what she was saying and stops in midsentence and she is fully awake and aware of her surroundings. She has a blank stare during these episodes. She also has other seizures in which she is starts of staring blankly but then falls down and goes into a generalized tonic-clonic seizure lasting 2-3 minutes. In the last 1 week she's had 4 such episodes. She has a baseline frequency of late of having one brief complex partial seizure a day and not more than one generalized seizure in a year or so. She is complaining of not sleeping well being up most of the night and sleeping only early morning for 4-5 hours a day. She is also having short-term memory difficulties and trouble remembering and feels that time she takes too many tablets of clonazepam and that's how she runs out side of her schedule. She has not had recent lab work done. She seems to tolerating Keppra and Depakote in the current dosages well without side effects. I do not have any detailed prior neurological records or imaging studies to review.  08/12/2016 visit Stacy Pratt: She is currently taking Keppra and Depakote. We weaned the patient off of Klonopin because on 2 occasions she called to report that her medication was stolen and then the second time her fianc discarded it. She is also had a positive urine drug screen in  the past. We will no longer prescribe klonopin to the patient. She states that she is having more seizures. She states that she had several small seizures. She states these seizures consist of her losing her train of thought while talking. She states that once she starts having small seizures she will eventually have a "grand mal seizure." She states her last grand mal was last week. She states her boyfriend found her on the floor. She had urinated on herself  and was drooling. She did bite her tongue. She states she is also very tired after the seizure. In the past her EEG has been unremarkable. She was seeing a neurologist in [facility] prior to her seeing Dr. Pearlean Pratt. I have not seen any previous notes from this neurologist. Patient does not feel that she's had a video EEG monitoring. She states that she is not doing any recreational drugs. She states that she did find two Klonopin in her nightstand  and took them this week  12/22/2016 visit Stacy Pratt : She returns for follow-up after last visit 4 months ago. She is accompanied by her fianc. She had a witnessed generalized tonic-clonic seizure 8 days ago. Her boyfriend was at home. He describes that she had a blank look on her face and had generalized tonic-clonic activity lasting 3-4 minutes. She was incontinent of urine. She did not hurt herself. Patient states that 2 days ago she also had brief episodes of what she felt were complex partial seizures but she did not lose consciousness. She remains on Depakote 1 g twice daily and Keppra 1500 mg twice daily and is tolerating them well without side effects. She was recently started back on Klonopin 1 mg 3 times daily by her psychiatrist. She states she does not sleep well despite taking Seroquel 100 mg at night. She feels her seizures may have been triggered by stress. She denies being noncompliant with her medications except occasionally missing a dose  . She does not drink alcohol or do any street drugs  04/12/18 UPDATE: Patient returns today for follow-up visit.  She states she has been compliant with Depakote and Keppra for seizure control.  At previous appointment it was recommended to increase Depakote to 1000 mg in the morning and 1500 mg in the evening.  Patient states since doing this her grand mal seizures went from 2 times monthly to 1 time monthly but continues to also have frequent small ones daily that last approximately 2 seconds stating that she stops  breathing and has a twitch in her face.  Most recent grand mal seizure was on 04/07/2018 which she states she was found down by her fianc sister on the kitchen floor, was incontinent of urine but denies tongue biting.  Patient was unsure of postictal length or actual length of seizure.  EMS was not called as she states that she was placed back in her bed where she could sleep.  She does have headaches approximately every day for approximately 1 week prior to her grand mal seizures and also states that she will have increased stress of her event.  Patient is unsure if her events have been around the time she has increased fatigue, increased or decreased caffeine, or decreased caloric intake.  Tobacco use approximately 10 cigarettes/day.  Denies recreational drug use or alcohol use.  Denies missing Keppra or Depakote around the time of her event and states she has been compliant.  She started Xanax by her psychiatrist approximately 1 year ago where she takes  4 tablets a day and denies any recent change in this.  She is followed regularly by her psychiatrist and denies use of therapist.   REVIEW OF SYSTEMS: Out of a complete 14 system review of symptoms, the patient complains only of the following symptoms, and all other reviewed systems are negative. Seizures   ALLERGIES: Allergies  Allergen Reactions  . Lamotrigine     Other reaction(s): NAUSEA, RASH, VOMITING  . Tramadol Other (See Comments)    SEIZURES  . Doxycycline Rash  . Penicillins Swelling and Rash    HOME MEDICATIONS: Outpatient Medications Prior to Visit  Medication Sig Dispense Refill  . ALPRAZolam (XANAX) 1 MG tablet Take 1 tablet by mouth daily as needed.    . divalproex (DEPAKOTE) 500 MG DR tablet Take two tablets by mouth in am and three tablets at night 150 tablet 3  . levETIRAcetam (KEPPRA) 750 MG tablet Take 2 tablets (1,500 mg total) by mouth 2 (two) times daily. 120 tablet 3  . clonazePAM (KLONOPIN) 1 MG tablet Take 1 mg by  mouth.    . Oxycodone HCl 10 MG TABS TK 1 T PO QID AS NEEDED  0  . QUEtiapine (SEROQUEL) 100 MG tablet TK 1 T PO HS  5   No facility-administered medications prior to visit.     PAST MEDICAL HISTORY: Past Medical History:  Diagnosis Date  . Arthritis   . MRSA (methicillin resistant Staphylococcus aureus)   . Osteoarthritis   . Seizures (HCC)    last seizure Jan 2016    PAST SURGICAL HISTORY: Past Surgical History:  Procedure Laterality Date  . ABDOMINAL SURGERY    . KNEE SURGERY    . mrsa    . REPLACEMENT TOTAL KNEE BILATERAL      FAMILY HISTORY: No family history on file.  SOCIAL HISTORY: Social History   Socioeconomic History  . Marital status: Single    Spouse name: Not on file  . Number of children: 2  . Years of education: 10th`  . Highest education level: Not on file  Occupational History  . Occupation: N/A  Social Needs  . Financial resource strain: Not on file  . Food insecurity:    Worry: Not on file    Inability: Not on file  . Transportation needs:    Medical: Not on file    Non-medical: Not on file  Tobacco Use  . Smoking status: Current Every Day Smoker    Packs/day: 0.50    Types: Cigarettes  . Smokeless tobacco: Never Used  Substance and Sexual Activity  . Alcohol use: No  . Drug use: No  . Sexual activity: Not Currently    Birth control/protection: None  Lifestyle  . Physical activity:    Days per week: Not on file    Minutes per session: Not on file  . Stress: Not on file  Relationships  . Social connections:    Talks on phone: Not on file    Gets together: Not on file    Attends religious service: Not on file    Active member of club or organization: Not on file    Attends meetings of clubs or organizations: Not on file    Relationship status: Not on file  . Intimate partner violence:    Fear of current or ex partner: Not on file    Emotionally abused: Not on file    Physically abused: Not on file    Forced sexual activity:  Not on file  Other  Topics Concern  . Not on file  Social History Narrative   Patient lives with her family    Patient Is left handed   Patient drinks sodas      PHYSICAL EXAM  Vitals:   04/12/18 0845  BP: 112/62  Pulse: 72  Weight: 195 lb 9.6 oz (88.7 kg)  Height:  (1.549 m)   Body mass index is 36.96 kg/m.  Generalized: Well developed, in no acute distress   Neurological examination  Mentation: Alert oriented to time, place, history taking. Follows all commands speech and language fluent Cranial nerve II-XII: Pupils were equal round reactive to light. Extraocular movements were full, visual field were full on confrontational test. Facial sensation and strength were normal. Uvula tongue midline. Head turning and shoulder shrug  were normal and symmetric. Motor: The motor testing reveals 5 over 5 strength of all 4 extremities. Good symmetric motor tone is noted throughout.  Sensory: Sensory testing is intact to soft touch on all 4 extremities. No evidence of extinction is noted.  Coordination: Cerebellar testing reveals good finger-nose-finger and heel-to-shin bilaterally.  Gait and station: Gait is normal. Tandem gait is normal. Romberg is negative. No drift is seen.  Reflexes: Deep tendon reflexes are symmetric and normal bilaterally.   DIAGNOSTIC DATA (LABS, IMAGING, TESTING) - I reviewed patient records, labs, notes, testing and imaging myself where available.  Lab Results  Component Value Date   WBC 8.5 08/12/2016   HGB 13.6 08/12/2016   HCT 41.5 08/12/2016   MCV 93 08/12/2016   PLT 167 08/12/2016      Component Value Date/Time   NA 140 08/12/2016 1539   K 4.4 08/12/2016 1539   CL 100 08/12/2016 1539   CO2 23 08/12/2016 1539   GLUCOSE 116 (H) 08/12/2016 1539   GLUCOSE 93 06/25/2015 1808   BUN 17 08/12/2016 1539   CREATININE 0.66 08/12/2016 1539   CALCIUM 9.0 08/12/2016 1539   PROT 7.5 08/12/2016 1539   ALBUMIN 4.2 08/12/2016 1539   AST 22 08/12/2016  1539   ALT 23 08/12/2016 1539   ALKPHOS 59 08/12/2016 1539   BILITOT 0.3 08/12/2016 1539   GFRNONAA 115 08/12/2016 1539   GFRAA 132 08/12/2016 1539      ASSESSMENT AND PLAN  77 year Caucasian lady with history of symptomatic epilepsy following childhood head trauma with complex partial seizures and generalized clonic seizures  with recent breakthrough seizures of unclear triggers.  Continues to have "grand mal" seizures approximately 1/month.  Plan: -Check levels of valproic acid, Keppra, CMP, CBC and ammonia levels -consider possible increase in Depakote depending on levels or possibly add additional antiepileptic medication -Consider long-term EEG monitoring if patient continues to have events -Recommended decrease stress control and close follow-up with psychiatrist  Follow-up with Aundra Millet, NP in 6 months   George Hugh, [facility]  [facility] [address] [place], [place] [address]  Phone [phone] Fax [phone]

## 2018-04-12 NOTE — Patient Instructions (Signed)
Your Plan:  Continue depakote  in the morning and  in the evening  Continue keppra  twice a day  Continue xanax as needed for anxiety  We will check lab work and will call you with results in the next couple of days - we will consider at that time for possible increase of current medication and additional seizure medication     Thank you for coming to see [place] at [facility]. I hope we have been able to provide you high quality care today.  You may receive a patient satisfaction survey over the next few weeks. We would appreciate your feedback and comments so that we may continue to improve ourselves and the health of our patients.

## 2018-04-14 ENCOUNTER — Telehealth: Payer: Self-pay

## 2018-04-14 LAB — CBC
Hematocrit: 42 % (ref 34.0–46.6)
Hemoglobin: 14.1 g/dL (ref 11.1–15.9)
MCH: 31.1 pg (ref 26.6–33.0)
MCHC: 33.6 g/dL (ref 31.5–35.7)
MCV: 93 fL (ref 79–97)
PLATELETS: 166 10*3/uL (ref 150–450)
RBC: 4.53 x10E6/uL (ref 3.77–5.28)
RDW: 13.6 % (ref 12.3–15.4)
WBC: 7.6 10*3/uL (ref 3.4–10.8)

## 2018-04-14 LAB — COMPREHENSIVE METABOLIC PANEL
ALBUMIN: 4.1 g/dL (ref 3.5–5.5)
ALK PHOS: 46 IU/L (ref 39–117)
ALT: 11 IU/L (ref 0–32)
AST: 15 IU/L (ref 0–40)
Albumin/Globulin Ratio: 1.5 (ref 1.2–2.2)
BILIRUBIN TOTAL: 0.3 mg/dL (ref 0.0–1.2)
BUN / CREAT RATIO: 20 (ref 9–23)
BUN: 16 mg/dL (ref 6–20)
CHLORIDE: 105 mmol/L (ref 96–106)
CO2: 19 mmol/L — ABNORMAL LOW (ref 20–29)
Calcium: 9 mg/dL (ref 8.7–10.2)
Creatinine, Ser: 0.82 mg/dL (ref 0.57–1.00)
GFR calc Af Amer: 106 mL/min/{1.73_m2} (ref >59)
GFR calc non Af Amer: 92 mL/min/{1.73_m2} (ref >59)
Globulin, Total: 2.8 g/dL (ref 1.5–4.5)
Glucose: 138 mg/dL — ABNORMAL HIGH (ref 65–99)
Potassium: 4.2 mmol/L (ref 3.5–5.2)
Sodium: 141 mmol/L (ref 134–144)
Total Protein: 6.9 g/dL (ref 6.0–8.5)

## 2018-04-14 LAB — VALPROIC ACID LEVEL: Valproic Acid Lvl: 73 ug/mL (ref 50–100)

## 2018-04-14 LAB — AMMONIA: AMMONIA: 49 ug/dL (ref 19–87)

## 2018-04-14 LAB — LEVETIRACETAM LEVEL: LEVETIRACETAM: 7.8 ug/mL — AB (ref 10.0–40.0)

## 2018-04-14 NOTE — Telephone Encounter (Signed)
I called patient. No answer, left a message asking pt to call me back.  

## 2018-04-14 NOTE — Telephone Encounter (Signed)
-----   Message from Stacy Hugh, NP sent at 04/14/2018  1:37 PM EDT ----- Please notify patient that all of her lab worked good except her Keppra level was low. Please remind her to ensure she is taking each dose at the prescribed amount. No change in plan at this time. Thank you.

## 2018-04-15 NOTE — Telephone Encounter (Signed)
I called patient on HOME number. No answer, left a message asking pt to call me back.

## 2018-05-07 NOTE — Telephone Encounter (Signed)
Patient has not returned calls. I have mailed her a letter making her aware that I have tried to reach her without success and I have attached a copy of the lab results. I advised her to contact us if she has any questions.

## 2018-09-28 ENCOUNTER — Other Ambulatory Visit: Payer: Self-pay | Admitting: Neurology

## 2018-09-29 ENCOUNTER — Ambulatory Visit: Payer: Medicaid Other | Admitting: Adult Health

## 2018-09-29 ENCOUNTER — Encounter: Payer: Self-pay | Admitting: Adult Health

## 2018-09-29 VITALS — BP 101/67 | HR 68 | Ht 61.0 in | Wt 208.2 lb

## 2018-09-29 DIAGNOSIS — G40319 Generalized idiopathic epilepsy and epileptic syndromes, intractable, without status epilepticus: Secondary | ICD-10-CM

## 2018-09-29 NOTE — Progress Notes (Signed)
I have read the note, and I agree with the clinical assessment and plan.  Lawsyn Heiler K Jadda Hunsucker   

## 2018-09-29 NOTE — Progress Notes (Signed)
PATIENT: Stacy Pratt DOB: 03-23-1981  REASON FOR VISIT: follow up HISTORY FROM: patient  HISTORY OF PRESENT ILLNESS: Today 09/29/18:  Stacy Pratt is a 37 year old female with a history of intractable seizures.  She returns today for follow-up.  She is currently on Keppra and Depakote.  She states that this past weekend she had 2 grand mal seizures.  She states that she lost consciousness and was convulsing in the extremities.  She reports that she did bite her tongue and had a loss of bladder.  She states that this was witnessed by a family member.  She states that she has "mini seizures" during the week.  She states that she we will start flinching but  she is aware of what is going on.  She states that she has missed the last 2 days of Depakote because her prescription ran out.  However states that she has not missed any medication prior to her seizure event this weekend.  The patient denies any recreational drug use with the exception of marijuana.  She does report sleep deprivation.  She is no longer on Klonopin but was switched to Xanax by her PCP.  She takes Xanax 4 times a day.  Denies abruptly stopping Xanax.  She returns today for evaluation.  HISTORY  12/22/2016 : She returns for follow-up after last visit 4 months ago. She is accompanied by her fianc. She had a witnessed generalized tonic-clonic seizure 8 days ago. Her boyfriend was at home. He describes that she had a blank look on her face and had generalized tonic-clonic activity lasting 3-4 minutes. She was incontinent of urine. She did not hurt herself. Patient states that 2 days ago she also had brief episodes of what she felt were complex partial seizures but she did not lose consciousness. She remains on Depakote 1 g twice daily and Keppra 1500 mg twice daily and is tolerating them well without side effects. She was recently started back on Klonopin 1 mg 3 times daily by her psychiatrist. She states she does not sleep well despite  taking Seroquel 100 mg at night. She feels her seizures may have been triggered by stress. She denies being noncompliant with her medications except occasionally missing a dose  . She does not drink alcohol or do any street drugs   REVIEW OF SYSTEMS: Out of a complete 14 system review of symptoms, the patient complains only of the following symptoms, and all other reviewed systems are negative.  See HPI  ALLERGIES: Allergies  Allergen Reactions  . Lamotrigine     Other reaction(s): NAUSEA, RASH, VOMITING  . Tramadol Other (See Comments)    SEIZURES  . Doxycycline Rash  . Penicillins Swelling and Rash    HOME MEDICATIONS: Outpatient Medications Prior to Visit  Medication Sig Dispense Refill  . ALPRAZolam (XANAX) 1 MG tablet Take 1 tablet by mouth daily as needed.    . divalproex (DEPAKOTE) 500 MG DR tablet TAKE 2 TABLETS BY MOUTH EVERY MORNING AND 3 TABLETS EVERY NIGHT 150 tablet 5  . levETIRAcetam (KEPPRA) 750 MG tablet Take 2 tablets (1,500 mg total) by mouth 2 (two) times daily. 120 tablet 3   No facility-administered medications prior to visit.     PAST MEDICAL HISTORY: Past Medical History:  Diagnosis Date  . Arthritis   . MRSA (methicillin resistant Staphylococcus aureus)   . Osteoarthritis   . Seizures (HCC)    last seizure Jan 2016    PAST SURGICAL HISTORY: Past Surgical History:  Procedure Laterality Date  . ABDOMINAL SURGERY    . KNEE SURGERY    . mrsa    . REPLACEMENT TOTAL KNEE BILATERAL      FAMILY HISTORY: History reviewed. No pertinent family history.  SOCIAL HISTORY: Social History   Socioeconomic History  . Marital status: Single    Spouse name: Not on file  . Number of children: 2  . Years of education: 10th`  . Highest education level: Not on file  Occupational History  . Occupation: N/A  Social Needs  . Financial resource strain: Not on file  . Food insecurity:    Worry: Not on file    Inability: Not on file  . Transportation  needs:    Medical: Not on file    Non-medical: Not on file  Tobacco Use  . Smoking status: Current Every Day Smoker    Packs/day: 0.50    Types: Cigarettes  . Smokeless tobacco: Never Used  Substance and Sexual Activity  . Alcohol use: No  . Drug use: No  . Sexual activity: Not Currently    Birth control/protection: None  Lifestyle  . Physical activity:    Days per week: Not on file    Minutes per session: Not on file  . Stress: Not on file  Relationships  . Social connections:    Talks on phone: Not on file    Gets together: Not on file    Attends religious service: Not on file    Active member of club or organization: Not on file    Attends meetings of clubs or organizations: Not on file    Relationship status: Not on file  . Intimate partner violence:    Fear of current or ex partner: Not on file    Emotionally abused: Not on file    Physically abused: Not on file    Forced sexual activity: Not on file  Other Topics Concern  . Not on file  Social History Narrative   Patient lives with her family    Patient Is left handed   Patient drinks sodas      PHYSICAL EXAM  Vitals:   09/29/18 0836  BP: 101/67  Pulse: 68  Weight: 208 lb 3.2 oz (94.4 kg)  Height: 5\' 1"  (1.549 m)   Body mass index is 39.34 kg/m.  Generalized: Well developed, in no acute distress   Neurological examination  Mentation: Alert oriented to time, place, history taking. Follows all commands speech and language fluent Cranial nerve II-XII: Pupils were equal round reactive to light. Extraocular movements were full, visual field were full on confrontational test. Facial sensation and strength were normal. Uvula tongue midline. Head turning and shoulder shrug  were normal and symmetric. Motor: The motor testing reveals 5 over 5 strength of all 4 extremities. Good symmetric motor tone is noted throughout.  Sensory: Sensory testing is intact to soft touch on all 4 extremities. No evidence of  extinction is noted.  Coordination: Cerebellar testing reveals good finger-nose-finger and heel-to-shin bilaterally.  Gait and station: Patient has a slight limp on the right due to a knee injury.  Tandem gait not attempted. Reflexes: Deep tendon reflexes are symmetric and normal bilaterally.   DIAGNOSTIC DATA (LABS, IMAGING, TESTING) - I reviewed patient records, labs, notes, testing and imaging myself where available.  Lab Results  Component Value Date   WBC 7.6 04/12/2018   HGB 14.1 04/12/2018   HCT 42.0 04/12/2018   MCV 93 04/12/2018   PLT 166 04/12/2018  Component Value Date/Time   NA 141 04/12/2018 0930   K 4.2 04/12/2018 0930   CL 105 04/12/2018 0930   CO2 19 (L) 04/12/2018 0930   GLUCOSE 138 (H) 04/12/2018 0930   GLUCOSE 93 06/25/2015 1808   BUN 16 04/12/2018 0930   CREATININE 0.82 04/12/2018 0930   CALCIUM 9.0 04/12/2018 0930   PROT 6.9 04/12/2018 0930   ALBUMIN 4.1 04/12/2018 0930   AST 15 04/12/2018 0930   ALT 11 04/12/2018 0930   ALKPHOS 46 04/12/2018 0930   BILITOT 0.3 04/12/2018 0930   GFRNONAA 92 04/12/2018 0930   GFRAA 106 04/12/2018 0930      ASSESSMENT AND PLAN 37 y.o. year old female  has a past medical history of Arthritis, MRSA (methicillin resistant Staphylococcus aureus), Osteoarthritis, and Seizures (HCC). here with:  1.  Intractable seizures  The patient will continue on Keppra and Depakote.  I educated the patient about taking her medication consistently and not letting her prescriptions run out.  We also went over different triggers for seizures such as sleep deprivation.  The patient will be sent to [facility] for admission to the epilepsy unit for prolonged EEG.  I will check blood work today.  Patient is advised that if her symptoms worsen or she develops new symptoms she should let us know.  She will follow-up in 6 months or sooner if needed.  I discussed plan of care with Dr. Salley Slaughter, MSN, NP-C  09/29/2018, 8:48 AM [facility] [address] [place], [place] [address] [phone]

## 2018-09-29 NOTE — Patient Instructions (Addendum)
Your Plan:  Continue Keppra and Depakote Blood work today Referral to [facility] for epilepsy monitoring If your symptoms worsen or you develop new symptoms please let us know.   Thank you for coming to see [place] at [facility]. I hope we have been able to provide you high quality care today.  You may receive a patient satisfaction survey over the next few weeks. We would appreciate your feedback and comments so that we may continue to improve ourselves and the health of our patients.

## 2018-10-02 LAB — COMPREHENSIVE METABOLIC PANEL
A/G RATIO: 1.3 (ref 1.2–2.2)
ALT: 14 IU/L (ref 0–32)
AST: 15 IU/L (ref 0–40)
Albumin: 3.9 g/dL (ref 3.5–5.5)
Alkaline Phosphatase: 54 IU/L (ref 39–117)
BUN/Creatinine Ratio: 22 (ref 9–23)
BUN: 14 mg/dL (ref 6–20)
CO2: 22 mmol/L (ref 20–29)
CREATININE: 0.65 mg/dL (ref 0.57–1.00)
Calcium: 9.3 mg/dL (ref 8.7–10.2)
Chloride: 103 mmol/L (ref 96–106)
GFR, EST AFRICAN AMERICAN: 131 mL/min/{1.73_m2} (ref >59)
GFR, EST NON AFRICAN AMERICAN: 114 mL/min/{1.73_m2} (ref >59)
GLOBULIN, TOTAL: 3 g/dL (ref 1.5–4.5)
Glucose: 100 mg/dL — ABNORMAL HIGH (ref 65–99)
Potassium: 4.2 mmol/L (ref 3.5–5.2)
Sodium: 141 mmol/L (ref 134–144)
Total Protein: 6.9 g/dL (ref 6.0–8.5)

## 2018-10-02 LAB — CBC WITH DIFFERENTIAL/PLATELET
BASOS: 1 %
Basophils Absolute: 0 10*3/uL (ref 0.0–0.2)
EOS (ABSOLUTE): 0.3 10*3/uL (ref 0.0–0.4)
EOS: 4 %
HEMATOCRIT: 41.4 % (ref 34.0–46.6)
HEMOGLOBIN: 13.6 g/dL (ref 11.1–15.9)
IMMATURE GRANS (ABS): 0 10*3/uL (ref 0.0–0.1)
IMMATURE GRANULOCYTES: 0 %
LYMPHS: 34 %
Lymphocytes Absolute: 2.9 10*3/uL (ref 0.7–3.1)
MCH: 30.6 pg (ref 26.6–33.0)
MCHC: 32.9 g/dL (ref 31.5–35.7)
MCV: 93 fL (ref 79–97)
MONOCYTES: 8 %
Monocytes Absolute: 0.7 10*3/uL (ref 0.1–0.9)
NEUTROS PCT: 53 %
Neutrophils Absolute: 4.6 10*3/uL (ref 1.4–7.0)
PLATELETS: 190 10*3/uL (ref 150–450)
RBC: 4.44 x10E6/uL (ref 3.77–5.28)
RDW: 13 % (ref 12.3–15.4)
WBC: 8.6 10*3/uL (ref 3.4–10.8)

## 2018-10-02 LAB — LEVETIRACETAM LEVEL: Levetiracetam Lvl: 9.5 ug/mL — ABNORMAL LOW (ref 10.0–40.0)

## 2018-10-07 ENCOUNTER — Telehealth: Payer: Self-pay | Admitting: *Deleted

## 2018-10-07 NOTE — Telephone Encounter (Signed)
LVM on home number requesting call back for lab results.

## 2018-10-08 NOTE — Telephone Encounter (Signed)
Called mobile number listed and was told this RN had the wrong number. Called home phone, LVM informing patient that her keppra level is slightly low. Encouraged her to take her medication as directed and not skip any doses. Advised her that the NP will wait for results from admission to [facility].  Informed her our office is now closed, opens Mon, left office number.

## 2019-04-21 ENCOUNTER — Telehealth: Payer: Self-pay

## 2019-04-21 NOTE — Telephone Encounter (Signed)
Spoke with Danella Sensing and she stated that the patient a 2 seizures on Monday 04/19/2019 that were caught on video. Her PCP recommended that she follow up with our office to have her medication adjusted.

## 2019-04-21 NOTE — Telephone Encounter (Signed)
See Video EMU 02-28-19 at [facility].

## 2019-04-25 NOTE — Telephone Encounter (Signed)
I can do a VV and explain the findings from [facility]

## 2019-04-25 NOTE — Telephone Encounter (Signed)
Spoke to caregiver Flossie Dibble (guardian).  Due to current COVID 19 pandemic, our office is severely reducing in office visits until further notice, in order to minimize the risk to our patients and healthcare providers. She stated they did not have capability to have VV.  She consented to TELEPHONE VISIT. [phone].

## 2019-04-27 ENCOUNTER — Encounter: Payer: Self-pay | Admitting: Adult Health

## 2019-04-27 ENCOUNTER — Other Ambulatory Visit: Payer: Self-pay

## 2019-04-27 ENCOUNTER — Ambulatory Visit (INDEPENDENT_AMBULATORY_CARE_PROVIDER_SITE_OTHER): Payer: Medicaid Other | Admitting: Adult Health

## 2019-04-27 DIAGNOSIS — R569 Unspecified convulsions: Secondary | ICD-10-CM

## 2019-04-27 NOTE — Progress Notes (Signed)
I agree with the above plan 

## 2019-04-27 NOTE — Progress Notes (Signed)
[facility] [address] [place]. [place] [id number] [phone]     Virtual Visit via Telephone Note  I connected with Stacy Pratt on 04/27/19 at 10:30 AM EDT by telephone located remotely at [facility] and verified that I am speaking with the correct person using two identifiers who reports being located at home.    Visit scheduled by RN. She discussed the limitations, risks, security and privacy concerns of performing an evaluation and management service by telephone and the availability of in person appointments. I also discussed with the patient that there may be a patient responsible charge related to this service. The patient expressed understanding and agreed to proceed. See telephone note for consent and additional scheduling information.    History of Present Illness:  Stacy Pratt is a 38 y.o. female who has been followed in this office for seizure events.  Was initially scheduled for face-to-face office follow up visit today time but due to COVID19, visit rescheduled for non-face-to-face telephone visit with patients consent. Unable to participate in video visit due to lack of access to device with camera.    Today 04/27/19 Stacy Pratt is a 38 year old female with a history of seizures.  [facility] video EEG monitoring.  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  The patient has remained on Depakote and Keppra.  The patient states today that she is having "10 seizures a day."  These are the mini seizures that she has described in the past.  She states that soon as she wakes up she begins to have a seizure.  [place].    09/29/18:  Stacy Pratt is a 38 year old female with a history of intractable seizures.  She returns today for follow-up.  She is currently on Keppra and  Depakote.  She states that this past weekend she had 2 grand mal seizures.  She states that she lost consciousness and was convulsing in the extremities.  She reports that she did bite her tongue and had a loss of bladder.  She states that this was witnessed by a family member.  She states that she has "mini seizures" during the week.  She states that she we will start flinching but  she is aware of what is going on.  She states that she has missed the last 2 days of Depakote because her prescription ran out.  However states that she has not missed any medication prior to her seizure event this weekend.  The patient denies any recreational drug use with the exception of marijuana.  She does report sleep deprivation.  She is no longer on Klonopin but was switched to Xanax by her PCP.  She takes Xanax 4 times a day.  Denies abruptly stopping Xanax.  She returns today for evaluation.  HISTORY  12/22/2016: She returns for follow-up after last visit 4 months ago. She is accompanied by her fianc. She had a witnessed generalized tonic-clonic seizure 8 days ago. Her boyfriend was at home. He describes that she had a blank look on her face and had generalized tonic-clonic activity lasting 3-4 minutes. She was incontinent of urine. She did not hurt herself. Patient states that 2 days ago she also had brief episodes of what she felt were complex partial seizures but she did not lose consciousness.She remains on Depakote 1 g twice  daily and Keppra 1500 mg twice daily and is tolerating them well without side effects. She was recently started back on Klonopin 1 mg 3 times daily by her psychiatrist. She states she does not sleep well despite taking Seroquel 100 mg at night. She feels her seizures may have been triggered by stress. She denies being noncompliant with her medications except occasionally missing a dose . She does not drink alcohol or do any street drugs    Observations/Objective:   Neurological examination   Mentation: Alert oriented to time, place, history taking.  speech and language fluent  Assessment and Plan:  1. Seizures   I explained to the patient that her video EEG results showed that her evenst were nonepileptic.  For now she will remain on Depakote and Keppra.  I will send these results over to her psychiatrist for their evaluation as well.  Patient is advised that if her symptoms worsen or she develops new symptoms she should let us know.   Follow Up Instructions:    FU after consultation with psychiatrist   I discussed the assessment and treatment plan with the patient.  The patient was provided an opportunity to ask questions and all were answered to their satisfaction. The patient agreed with the plan and verbalized an understanding of the instructions.   I provided 12 minutes of non-face-to-face time during this encounter.    Butch Penny, NP-C  [facility] [address] [place], [place] [address]  Phone [phone] Fax [phone]

## 2019-05-25 ENCOUNTER — Ambulatory Visit: Payer: Medicaid Other | Admitting: Adult Health

## 2019-09-18 ENCOUNTER — Other Ambulatory Visit: Payer: Self-pay | Admitting: Adult Health

## 2019-09-19 ENCOUNTER — Telehealth: Payer: Self-pay

## 2019-09-19 ENCOUNTER — Other Ambulatory Visit: Payer: Self-pay

## 2019-09-19 MED ORDER — DIVALPROEX SODIUM 500 MG PO DR TAB: DELAYED_RELEASE_TABLET | ORAL | 1 refills | Status: DC

## 2019-09-19 NOTE — Telephone Encounter (Signed)
If patient calls back please schedule her with Jinny Blossom NP for a 6 month follow up for seizure and refills.  VM was left for pt to call back. We receive a refill for her seizure medication.

## 2019-12-02 ENCOUNTER — Other Ambulatory Visit: Payer: Self-pay | Admitting: Adult Health

## 2020-03-08 ENCOUNTER — Telehealth: Payer: Self-pay | Admitting: Adult Health

## 2020-03-08 DIAGNOSIS — R569 Unspecified convulsions: Secondary | ICD-10-CM

## 2020-03-08 NOTE — Telephone Encounter (Signed)
I called guardian, Hope Scales, PA at [facility] on [address] is who they want.

## 2020-03-08 NOTE — Telephone Encounter (Signed)
Pt guardian fay called to request a referral to a neurologist in [place] states  is far for them and would liike something closer

## 2020-03-13 ENCOUNTER — Telehealth: Payer: Self-pay | Admitting: Adult Health

## 2020-03-13 NOTE — Telephone Encounter (Signed)
Called [facility] and relayed patient needs to call and schedule her apt because of NO- Shows . I will call and relay message to patient .  Patient needs to call [phone] fax [phone]  have faxed her referral .

## 2020-03-19 ENCOUNTER — Ambulatory Visit: Payer: Medicaid Other | Admitting: Adult Health

## 2020-03-19 ENCOUNTER — Telehealth: Payer: Self-pay

## 2020-03-19 ENCOUNTER — Encounter: Payer: Self-pay | Admitting: Adult Health

## 2020-03-19 NOTE — Telephone Encounter (Signed)
Pt did not show for their appt with Megan, NP today.  

## 2021-05-26 ENCOUNTER — Other Ambulatory Visit: Payer: Self-pay | Admitting: Adult Health

## 2021-05-30 NOTE — Telephone Encounter (Signed)
Pt must call to make f/u appt for further refills

## 2021-06-27 ENCOUNTER — Other Ambulatory Visit: Payer: Self-pay | Admitting: Adult Health

## 2021-06-27 NOTE — Telephone Encounter (Signed)
I called her home and work # and NIS, then mobile was not her #. She needs to call and make appt.
